# Patient Record
Sex: Female | Born: 1935 | Race: White | Hispanic: No | Smoking: Former smoker
Health system: Southern US, Community
[De-identification: ages and names within clinical notes are randomized; demographics above are authoritative.]

## PROBLEM LIST (undated history)

## (undated) DIAGNOSIS — R921 Mammographic calcification found on diagnostic imaging of breast: Secondary | ICD-10-CM

## (undated) DIAGNOSIS — M1990 Unspecified osteoarthritis, unspecified site: Secondary | ICD-10-CM

## (undated) DIAGNOSIS — K219 Gastro-esophageal reflux disease without esophagitis: Secondary | ICD-10-CM

## (undated) DIAGNOSIS — J449 Chronic obstructive pulmonary disease, unspecified: Secondary | ICD-10-CM

## (undated) HISTORY — DX: Mammographic calcification found on diagnostic imaging of breast: R92.1

## (undated) HISTORY — PX: EYE SURGERY: SHX253

## (undated) HISTORY — PX: FRACTURE SURGERY: SHX138

## (undated) HISTORY — DX: Chronic obstructive pulmonary disease, unspecified: J44.9

## (undated) HISTORY — DX: Gastro-esophageal reflux disease without esophagitis: K21.9

## (undated) HISTORY — PX: TONSILLECTOMY: SUR1361

## (undated) HISTORY — PX: COLONOSCOPY: SHX174

---

## 2000-05-26 ENCOUNTER — Emergency Department (HOSPITAL_COMMUNITY): Admission: EM | Admit: 2000-05-26 | Discharge: 2000-05-26 | Payer: Self-pay | Admitting: Emergency Medicine

## 2000-05-26 ENCOUNTER — Encounter: Payer: Self-pay | Admitting: Emergency Medicine

## 2001-04-19 ENCOUNTER — Other Ambulatory Visit: Admission: RE | Admit: 2001-04-19 | Discharge: 2001-04-19 | Payer: Self-pay | Admitting: Family Medicine

## 2001-04-27 ENCOUNTER — Encounter: Admission: RE | Admit: 2001-04-27 | Discharge: 2001-04-27 | Payer: Self-pay | Admitting: Family Medicine

## 2001-04-27 ENCOUNTER — Encounter: Payer: Self-pay | Admitting: Family Medicine

## 2001-06-09 ENCOUNTER — Encounter: Payer: Self-pay | Admitting: Family Medicine

## 2001-06-09 ENCOUNTER — Encounter: Admission: RE | Admit: 2001-06-09 | Discharge: 2001-06-09 | Payer: Self-pay | Admitting: Family Medicine

## 2001-06-14 ENCOUNTER — Encounter: Admission: RE | Admit: 2001-06-14 | Discharge: 2001-06-14 | Payer: Self-pay | Admitting: Family Medicine

## 2001-06-14 ENCOUNTER — Encounter: Payer: Self-pay | Admitting: Family Medicine

## 2002-05-01 ENCOUNTER — Encounter: Admission: RE | Admit: 2002-05-01 | Discharge: 2002-05-01 | Payer: Self-pay | Admitting: Family Medicine

## 2002-05-01 ENCOUNTER — Encounter: Payer: Self-pay | Admitting: Family Medicine

## 2002-05-19 ENCOUNTER — Encounter: Payer: Self-pay | Admitting: Cardiology

## 2002-05-19 ENCOUNTER — Encounter: Payer: Self-pay | Admitting: *Deleted

## 2002-05-19 ENCOUNTER — Observation Stay (HOSPITAL_COMMUNITY): Admission: EM | Admit: 2002-05-19 | Discharge: 2002-05-20 | Payer: Self-pay | Admitting: *Deleted

## 2002-05-20 ENCOUNTER — Encounter: Payer: Self-pay | Admitting: Cardiology

## 2003-05-08 ENCOUNTER — Encounter: Payer: Self-pay | Admitting: Family Medicine

## 2003-05-08 ENCOUNTER — Encounter: Admission: RE | Admit: 2003-05-08 | Discharge: 2003-05-08 | Payer: Self-pay | Admitting: Family Medicine

## 2003-09-05 ENCOUNTER — Encounter: Admission: RE | Admit: 2003-09-05 | Discharge: 2003-09-05 | Payer: Self-pay | Admitting: Family Medicine

## 2004-06-30 ENCOUNTER — Encounter: Admission: RE | Admit: 2004-06-30 | Discharge: 2004-06-30 | Payer: Self-pay | Admitting: Family Medicine

## 2005-08-05 ENCOUNTER — Encounter: Admission: RE | Admit: 2005-08-05 | Discharge: 2005-08-05 | Payer: Self-pay | Admitting: Family Medicine

## 2005-08-25 ENCOUNTER — Encounter: Admission: RE | Admit: 2005-08-25 | Discharge: 2005-08-25 | Payer: Self-pay | Admitting: Family Medicine

## 2005-09-08 ENCOUNTER — Other Ambulatory Visit: Admission: RE | Admit: 2005-09-08 | Discharge: 2005-09-08 | Payer: Self-pay | Admitting: Family Medicine

## 2006-05-12 ENCOUNTER — Encounter: Admission: RE | Admit: 2006-05-12 | Discharge: 2006-05-12 | Payer: Self-pay | Admitting: Family Medicine

## 2006-05-26 ENCOUNTER — Encounter: Admission: RE | Admit: 2006-05-26 | Discharge: 2006-05-26 | Payer: Self-pay | Admitting: Family Medicine

## 2006-06-03 ENCOUNTER — Ambulatory Visit: Admission: RE | Admit: 2006-06-03 | Discharge: 2006-06-03 | Payer: Self-pay | Admitting: Emergency Medicine

## 2006-06-03 ENCOUNTER — Ambulatory Visit: Payer: Self-pay | Admitting: Emergency Medicine

## 2006-08-17 ENCOUNTER — Ambulatory Visit: Payer: Self-pay | Admitting: Emergency Medicine

## 2006-08-17 LAB — CONVERTED CEMR LAB
BUN: 11 mg/dL (ref 6–23)
CO2: 28 meq/L (ref 19–32)
Calcium: 10.3 mg/dL (ref 8.4–10.5)
GFR calc non Af Amer: 87 mL/min
Glucose, Bld: 100 mg/dL — ABNORMAL HIGH (ref 70–99)

## 2006-08-24 ENCOUNTER — Ambulatory Visit: Payer: Self-pay | Admitting: Cardiovascular Disease

## 2006-08-30 ENCOUNTER — Ambulatory Visit: Payer: Self-pay | Admitting: Emergency Medicine

## 2006-09-15 ENCOUNTER — Encounter: Admission: RE | Admit: 2006-09-15 | Discharge: 2006-09-15 | Payer: Self-pay | Admitting: Family Medicine

## 2007-02-18 ENCOUNTER — Ambulatory Visit: Payer: Self-pay | Admitting: Emergency Medicine

## 2007-02-18 LAB — CONVERTED CEMR LAB
CO2: 28 meq/L (ref 19–32)
Chloride: 104 meq/L (ref 96–112)
GFR calc Af Amer: 125 mL/min
Glucose, Bld: 97 mg/dL (ref 70–99)
Potassium: 4.3 meq/L (ref 3.5–5.1)
Sodium: 140 meq/L (ref 135–145)

## 2007-02-22 ENCOUNTER — Ambulatory Visit: Payer: Self-pay | Admitting: Cardiology

## 2007-03-01 ENCOUNTER — Ambulatory Visit: Payer: Self-pay | Admitting: Emergency Medicine

## 2007-07-08 DIAGNOSIS — J449 Chronic obstructive pulmonary disease, unspecified: Secondary | ICD-10-CM | POA: Insufficient documentation

## 2007-07-08 DIAGNOSIS — J309 Allergic rhinitis, unspecified: Secondary | ICD-10-CM | POA: Insufficient documentation

## 2007-08-18 ENCOUNTER — Ambulatory Visit: Payer: Self-pay | Admitting: Emergency Medicine

## 2007-08-25 ENCOUNTER — Encounter: Payer: Self-pay | Admitting: Emergency Medicine

## 2007-08-25 ENCOUNTER — Ambulatory Visit: Payer: Self-pay | Admitting: Cardiology

## 2007-08-29 ENCOUNTER — Ambulatory Visit: Payer: Self-pay | Admitting: Emergency Medicine

## 2007-10-07 ENCOUNTER — Telehealth (INDEPENDENT_AMBULATORY_CARE_PROVIDER_SITE_OTHER): Payer: Self-pay | Admitting: *Deleted

## 2007-11-10 ENCOUNTER — Encounter: Admission: RE | Admit: 2007-11-10 | Discharge: 2007-11-10 | Payer: Self-pay | Admitting: Family Medicine

## 2007-11-27 ENCOUNTER — Emergency Department (HOSPITAL_COMMUNITY): Admission: EM | Admit: 2007-11-27 | Discharge: 2007-11-27 | Payer: Self-pay | Admitting: Emergency Medicine

## 2008-01-10 ENCOUNTER — Ambulatory Visit: Payer: Self-pay | Admitting: Emergency Medicine

## 2008-01-10 DIAGNOSIS — J479 Bronchiectasis, uncomplicated: Secondary | ICD-10-CM

## 2008-01-10 DIAGNOSIS — J984 Other disorders of lung: Secondary | ICD-10-CM

## 2008-01-11 ENCOUNTER — Encounter: Payer: Self-pay | Admitting: Emergency Medicine

## 2008-07-12 ENCOUNTER — Ambulatory Visit: Payer: Self-pay | Admitting: Emergency Medicine

## 2008-11-14 ENCOUNTER — Encounter: Admission: RE | Admit: 2008-11-14 | Discharge: 2008-11-14 | Payer: Self-pay | Admitting: Family Medicine

## 2009-07-31 ENCOUNTER — Ambulatory Visit: Payer: Self-pay | Admitting: Emergency Medicine

## 2009-08-01 ENCOUNTER — Telehealth: Payer: Self-pay | Admitting: Emergency Medicine

## 2009-10-21 ENCOUNTER — Ambulatory Visit: Payer: Self-pay | Admitting: Emergency Medicine

## 2010-01-03 ENCOUNTER — Encounter: Admission: RE | Admit: 2010-01-03 | Discharge: 2010-01-03 | Payer: Self-pay | Admitting: Family Medicine

## 2010-03-26 ENCOUNTER — Ambulatory Visit: Payer: Self-pay | Admitting: Emergency Medicine

## 2010-05-06 ENCOUNTER — Encounter: Payer: Self-pay | Admitting: Cardiology

## 2010-05-07 ENCOUNTER — Encounter: Payer: Self-pay | Admitting: Cardiology

## 2010-05-07 ENCOUNTER — Encounter: Admission: RE | Admit: 2010-05-07 | Discharge: 2010-05-07 | Payer: Self-pay | Admitting: Family Medicine

## 2010-05-13 ENCOUNTER — Ambulatory Visit: Payer: Self-pay | Admitting: Cardiology

## 2010-05-13 DIAGNOSIS — R079 Chest pain, unspecified: Secondary | ICD-10-CM

## 2010-05-13 DIAGNOSIS — R0602 Shortness of breath: Secondary | ICD-10-CM | POA: Insufficient documentation

## 2010-05-16 ENCOUNTER — Ambulatory Visit: Payer: Self-pay | Admitting: Emergency Medicine

## 2010-05-21 ENCOUNTER — Ambulatory Visit: Payer: Self-pay | Admitting: Cardiology

## 2010-05-21 ENCOUNTER — Encounter: Payer: Self-pay | Admitting: Cardiology

## 2010-05-26 ENCOUNTER — Encounter (INDEPENDENT_AMBULATORY_CARE_PROVIDER_SITE_OTHER): Payer: Self-pay | Admitting: *Deleted

## 2010-10-28 ENCOUNTER — Encounter: Payer: Self-pay | Admitting: Critical Care Medicine

## 2010-11-02 LAB — CONVERTED CEMR LAB
Calcium: 10.4 mg/dL (ref 8.4–10.5)
GFR calc Af Amer: 125 mL/min
GFR calc non Af Amer: 104 mL/min
Sodium: 140 meq/L (ref 135–145)

## 2010-11-03 ENCOUNTER — Encounter
Admission: RE | Admit: 2010-11-03 | Discharge: 2010-11-03 | Payer: Self-pay | Source: Home / Self Care | Attending: Internal Medicine | Admitting: Internal Medicine

## 2010-11-04 NOTE — Assessment & Plan Note (Signed)
Summary: Karen Costa/chest pain abn ekg/jml   Visit Type:  Initial Consult Primary Provider:  Laren Boom Joseph Art)   History of Present Illness: The patient presents for evaluation of chest discomfort and abnormal EKG.  She was evaluated 8 years ago for chest discomfort and had a negative stress perfusion study. Since that time she has had problems with dyspnea and has been treated for reflux causing cough as well as bronchiectasis. She remains active doing some farming. She said that about a week and a half ago she had some chest discomfort. She really points to a left upper quadrant abdominal discomfort or lower chest discomfort. She said it was sharp and fleeting. She said that otherwise she has not been getting any substernal chest pressure, neck or arm discomfort. She has not been having any palpitations, presyncope or syncope. She is not having any PND or orthopnea. She will get short of breath if she bends over to do work. She has some very mild lower extremities swelling if she stands for a long time.  Of note I did review her EKG from August 2. She had normal sinus rhythm. 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 I did not have an old EKG for comparison.  Preventive Screening-Counseling & Management  Alcohol-Tobacco     Smoking Status: quit     Year Quit: 1985  Current Medications (verified): 1)  Flutter Valve .... Use Flutter Valve 6 Times A Day As Needed 2)  Proair Hfa 108 (90 Base) Mcg/act  Aers (Albuterol Sulfate) .... Use 2 Puffs Every 4 Hours As Needed For Shortness of Breath 3)  Vitamin D 1000 Unit Tabs (Cholecalciferol) .Marland Kitchen.. 1 By Mouth Daily 4)  Ocuvite Preservision  Tabs (Multiple Vitamins-Minerals) .Marland Kitchen.. 1 By Mouth Daily 5)  Fish Oil 1000 Mg Caps (Omega-3 Fatty Acids) .Marland Kitchen.. 1 By Mouth Daily 6)  Prilosec 20 Mg Cpdr (Omeprazole) .Marland Kitchen.. 1 By Mouth Daily 7)  Fosamax 70 Mg Tabs (Alendronate Sodium) .... Once A Week 8)  Calcium  Carbonate-Vitamin D 600-400 Mg-Unit  Tabs (Calcium Carbonate-Vitamin D) .... Take 1 Tablet By Mouth Two Times A Day 9)  Vein-Gard .... Apply Topically Two Times A Day  Allergies (verified): No Known Drug Allergies  Past History:  Past Medical History: Bronchiectasis GERD  Past Surgical History: Reviewed history from 05/12/2010 and no changes required. Tonsillectomy  Family History: Father-heart disease (age 41s), CA-?type Brother-heart disease (age 18, CABG) sister-heart disease (unclear heart problems) mother-breast CA  Review of Systems       As stated in the HPI and negative for all other systems.   Vital Signs:  Patient profile:   77 year old female Height:      64 inches Weight:      115 pounds Pulse rate:   85 / minute BP sitting:   148 / 88  (left arm) Cuff size:   regular  Vitals Entered By: Carlye Grippe (May 13, 2010 11:37 AM)  Physical Exam  General:  Well developed, well nourished, in no acute distress. Head:  normocephalic and atraumatic Eyes:  PERRLA/EOM intact; conjunctiva and lids normal. Mouth:  Teeth, gums and palate normal. Oral mucosa normal. Neck:  Neck supple, no JVD. No masses, thyromegaly or abnormal cervical nodes. Chest Wall:  no deformities or breast masses noted Lungs:  Clear bilaterally to auscultation and percussion. Abdomen:  Bowel sounds positive; abdomen soft and non-tender without masses, organomegaly, or hernias noted. No hepatosplenomegaly. Msk:  Back normal,  normal gait. Muscle strength and tone normal. Extremities:  No clubbing or cyanosis. Neurologic:  Alert and oriented x 3. Skin:  Intact without lesions or rashes. Cervical Nodes:  no significant adenopathy Axillary Nodes:  no significant adenopathy Inguinal Nodes:  no significant adenopathy Psych:  Normal affect.   Detailed Cardiovascular Exam  Neck    Carotids: Carotids full and equal bilaterally without bruits.      Neck Veins: 4cm JVD.    Heart     Inspection: no deformities or lifts noted.      Palpation: normal PMI with no thrills palpable.      Auscultation: S1 and S2 within normal limits, no S3, no S4, midsystolic click without murmur  Vascular    Abdominal Aorta: no palpable masses, pulsations, or audible bruits.      Femoral Pulses: normal femoral pulses bilaterally.      Pedal Pulses: normal pedal pulses bilaterally.      Radial Pulses: normal radial pulses bilaterally.      Peripheral Circulation: no clubbing, cyanosis, or edema noted with normal capillary refill.     Impression & Recommendations:  Problem # 1:  DYSPNEA (ICD-786.05) The patient does have some dyspnea which is mild and probably related to chronic lung disease. She does have a slight abnormality on physical exam as described. I would like to followup with an echocardiogram to rule out in particular any mitral valve disorder. If this is normal I would not suggest further cardiovascular testing.  Problem # 2:  CHEST PAIN (ICD-786.50) Her chest discomfort was fleeting and atypical. She has no significant risk factors. At this point I am not suggesting stress perfusion or other stress testing.  Other Orders: 2-D Echocardiogram (2D Echo)  Patient Instructions: 1)  Your physician has requested that you have an echocardiogram.  Echocardiography is a painless test that uses sound waves to create images of your heart. It provides your doctor with information about the size and shape of your heart and how well your heart's chambers and valves are working.  This procedure takes approximately one hour. There are no restrictions for this procedure. If the results of your test are normal or stable, you will receive a letter. If they are abnormal, the nurse will contact you by phone.  2)  Follow up as needed

## 2010-11-04 NOTE — Assessment & Plan Note (Signed)
Summary: cough, bronchiectasis   Visit Type:  Follow-up Primary Provider/Referring Provider:  Laren Boom Joseph Art)  CC:  6 mo COPD and bronchiectasis follow-up.  The patient says she has done better off Advair and Spiriva. Her cough is much better since taking Prilosec daily.Marland Kitchen  History of Present Illness: [name] is a 77 year old woman who follows up today for her COPD and bronchiectasis.  She also has a history of a right sided pulmonary nodule. her last CT Scan, 11/08, showed stable RLL scar. At that visit we decided that she did not require further serial scanning unles sx develop (none done since).   ROV 07/31/09 -- returns for regular f/u. Staying very active without breathing limitation. Cough is stable, every day, clear to tan phlegm.  Taking Spiriva once daily, Advair two times a day. Rarely needs albuterol  ROV 10/21/09 -- She returns for f/u. Tells me that she started omeprazole in 11/10 and that it made her cough MUCH better. She also stopped taking the Spiriva and Advair. Her activity level hasn't suffered - doing quite well. Occasionally uses flutter valve. Denies cough!!  June 22, 201--Presents for an acute office visit. Complains of sore throat, sneezing, prod cough with white mucus, tightness in chest, some chills x1.5weeks. She had some leftover Augmentin which she is on day 3/10. Starting to feel some better. Denies chest pain,  orthopnea, hemoptysis, fever, n/v/d, edema, headache. No OTC used.   ROV 05/16/10 -- f/u for cough and bronchiectasis. Recently treated for possible bronchitis in setting URI. Still with some morning cough. Believes that Prilosec has helped with GERD and cough. Tells me that she recently had an ECG at her PCP, was different and referred her to see Dr Antoine Poche. Planning for TTE next week. Able to exert without limitation. Rare ProAir use.   Current Medications (verified): 1)  Flutter Valve .... Use Flutter Valve 6 Times A Day As Needed 2)   Proair Hfa 108 (90 Base) Mcg/act  Aers (Albuterol Sulfate) .... Use 2 Puffs Every 4 Hours As Needed For Shortness of Breath 3)  Vitamin D 1000 Unit Tabs (Cholecalciferol) .Marland Kitchen.. 1 By Mouth Daily 4)  Ocuvite Preservision  Tabs (Multiple Vitamins-Minerals) .Marland Kitchen.. 1 By Mouth Daily 5)  Fish Oil 1000 Mg Caps (Omega-3 Fatty Acids) .Marland Kitchen.. 1 By Mouth Daily 6)  Prilosec 20 Mg Cpdr (Omeprazole) .Marland Kitchen.. 1 By Mouth Daily 7)  Fosamax 70 Mg Tabs (Alendronate Sodium) .... Once A Week 8)  Calcium Carbonate-Vitamin D 600-400 Mg-Unit  Tabs (Calcium Carbonate-Vitamin D) .... Take 1 Tablet By Mouth Two Times A Day 9)  Vein-Gard .... Apply Topically Two Times A Day  Allergies (verified): No Known Drug Allergies  Vital Signs:  Patient profile:   77 year old female Height:      64 inches (162.56 cm) Weight:      116 pounds (52.73 kg) BMI:     19.98 O2 Sat:      94 % on Room air Temp:     98.1 degrees F (36.72 degrees C) oral Pulse rate:   85 / minute BP sitting:   118 / 72  (left arm) Cuff size:   regular  Vitals Entered By: Michel Bickers CMA (May 16, 2010 11:01 AM)  O2 Sat at Rest %:  94 O2 Flow:  Room air CC: 6 mo COPD and bronchiectasis follow-up.  The patient says she has done better off Advair and Spiriva. Her cough is much better since taking Prilosec daily. Comments Medications reviewed with the  patient. Daytime phone verified. Michel Bickers CMA  May 16, 2010 11:02 AM   Physical Exam  General:  well developed, well nourished, in no acute distress Eyes:  conjunctiva and sclera clear Nose:  no deformity, discharge, inflammation, or lesions Mouth:  no deformity or lesions Neck:  no masses, thyromegaly, or abnormal cervical nodes Lungs:  scattered squeeks L > R Heart:  regular rate and rhythm, S1, S2 without murmurs, rubs, gallops, or clicks Abdomen:  not examined Msk:  no deformity or scoliosis noted with normal posture Extremities:  no clubbing, cyanosis, edema, or deformity noted Skin:  intact  without lesions or rashes Psych:  alert and cooperative; normal mood and affect; normal attention span and concentration   Impression & Recommendations:  Problem # 1:  COPD (ICD-496) Doing well off scheduled BD's - as needed ProAir.  - rov q42yr or as needed   Problem # 2:  BRONCHIECTASIS WITHOUT ACUTE EXACERBATION (ICD-494.0)  Other Orders: Est. Patient Level III ([address])  Patient Instructions: 1)  Continue your ProAir as needed  2)  Follow up with Dr Delton Coombes in 1 year or sooner if you have any problems.

## 2010-11-04 NOTE — Medication Information (Signed)
Summary: RX Folder/ MED LIST [facility]  RX Folder/ MED LIST FAMILY MEDICINE   Imported By: Dorise Hiss 05/08/2010 16:30:32  _____________________________________________________________________  External Attachment:    Type:   Image     Comment:   External Document

## 2010-11-04 NOTE — Assessment & Plan Note (Signed)
Summary: Acute NP office visit   Primary Provider/Referring Provider:  Mazzochi  CC:  sore throat, sneezing, prod cough with white mucus, tightness in chest, some chills x1.5weeks - states had amoxicillin at home and took it sun, and mon and tues.  History of Present Illness: Karen Costa is a 77 year old woman who follows up today for her COPD and bronchiectasis.  She also has a history of a right sided pulmonary nodule. her last CT Scan, 11/08, showed stable RLL scar. At that visit we decided that she did not require further serial scanning unles sx develop (none done since).   ROV 07/31/09 -- returns for regular f/u. Staying very active without breathing limitation. Cough is stable, every day, clear to tan phlegm.  Taking Spiriva once daily, Advair two times a day. Rarely needs albuterol  ROV 10/21/09 -- She returns for f/u. Tells me that she started omeprazole in 11/10 and that it made her cough MUCH better. She also stopped taking the Spiriva and Advair. Her activity level hasn't suffered - doing quite well. Occasionally uses flutter valve. Denies cough!!  June 22, 201--Presents for an acute office visit. Complains of sore throat, sneezing, prod cough with white mucus, tightness in chest, some chills x1.5weeks. She had some leftover Augmentin which she is on day 3/10. Starting to feel some better. Denies chest pain,  orthopnea, hemoptysis, fever, n/v/d, edema, headache. No OTC used.   Medications Prior to Update: 1)  Flutter Valve .... Use Flutter Valve 6 Times A Day As Needed 2)  Proair Hfa 108 (90 Base) Mcg/act  Aers (Albuterol Sulfate) .... Use 2 Puffs Every 4 Hours As Needed For Shortness of Breath 3)  Vitamin D 1000 Unit Tabs (Cholecalciferol) .Marland Kitchen.. 1 By Mouth Daily 4)  Ocuvite Preservision  Tabs (Multiple Vitamins-Minerals) .Marland Kitchen.. 1 By Mouth Daily 5)  Fish Oil 1000 Mg Caps (Omega-3 Fatty Acids) .Marland Kitchen.. 1 By Mouth Daily 6)  New Chapter Bone Strength .... 1 By Mouth Daily 7)  Prilosec 20 Mg  Cpdr (Omeprazole) .Marland Kitchen.. 1 By Mouth Daily  Current Medications (verified): 1)  Flutter Valve .... Use Flutter Valve 6 Times A Day As Needed 2)  Proair Hfa 108 (90 Base) Mcg/act  Aers (Albuterol Sulfate) .... Use 2 Puffs Every 4 Hours As Needed For Shortness of Breath 3)  Vitamin D 1000 Unit Tabs (Cholecalciferol) .Marland Kitchen.. 1 By Mouth Daily 4)  Ocuvite Preservision  Tabs (Multiple Vitamins-Minerals) .Marland Kitchen.. 1 By Mouth Daily 5)  Fish Oil 1000 Mg Caps (Omega-3 Fatty Acids) .Marland Kitchen.. 1 By Mouth Daily 6)  Prilosec 20 Mg Cpdr (Omeprazole) .Marland Kitchen.. 1 By Mouth Daily 7)  Fosamax 70 Mg Tabs (Alendronate Sodium) .... Once A Week 8)  Calcium Carbonate-Vitamin D 600-400 Mg-Unit  Tabs (Calcium Carbonate-Vitamin D) .... Take 1 Tablet By Mouth Two Times A Day  Allergies (verified): No Known Drug Allergies  Past History:  Past Medical History: Last updated: 07/08/2007 Allergic rhinitis COPD  Family History: Last updated: 07/12/2008 father-heart disease, CA-?type brother-heart disease sister-heart disease mother-breast CA  Social History: Last updated: 07/12/2008 Patient states former smoker. Quit smoking 20 yrs ago.  Smoked x 41yrs upto 1ppd. Pt is married with children.   Pt is retired Visual merchandiser.  Risk Factors: Smoking Status: quit (07/12/2008) Packs/Day: erratic (07/08/2007)  Review of Systems      See HPI  Vital Signs:  Patient profile:   77 year old female Height:      64 inches Weight:      117 pounds BMI:  20.16 O2 Sat:      94 % on Room air Temp:     97.5 degrees F oral Pulse rate:   85 / minute BP sitting:   130 / 80  (left arm) Cuff size:   regular  Vitals Entered By: Boone Master CNA/MA (March 26, 2010 9:33 AM)  O2 Flow:  Room air CC: sore throat, sneezing, prod cough with white mucus, tightness in chest, some chills x1.5weeks - states had amoxicillin at home and took it sun, mon and tues Is Patient Diabetic? No Comments Medications reviewed with patient Daytime contact number  verified with patient. Boone Master CNA/MA  March 26, 2010 9:35 AM    Physical Exam  Additional Exam:  GEN: A/Ox3; pleasant , NAD HEENT:  [place]/AT, , EACs-clear, TMs-wnl, NOSE-clear, THROAT-clear NECK:  Supple w/ fair ROM; no JVD; normal carotid impulses w/o bruits; no thyromegaly or nodules palpated; no lymphadenopathy. RESP  Coarse BS w/ no wheeizng  CARD:  RRR, no m/r/g   GI:   Soft & nt; nml bowel sounds; no organomegaly or masses detected. [place]: Warm bil,  no calf tenderness edema Neuro: no focal deficits noted.    Impression & Recommendations:  Problem # 1:  BRONCHIECTASIS WITHOUT ACUTE EXACERBATION (ICD-494.0) Flare REC : Hold fish oil and fosamax for 2 weeks until cough is gone.  Finish Augmentin 875mg  two times a day (total 10 days) Mucinex DM two times a day as needed cough/congestion Increase fluids.  Please contact office for sooner follow up if symptoms do not improve or worsen  follow up Dr. Delton Coombes as scheduled and as needed   Medications Added to Medication List This Visit: 1)  Fosamax 70 Mg Tabs (Alendronate sodium) .... Once a week 2)  Calcium Carbonate-vitamin D 600-400 Mg-unit Tabs (Calcium carbonate-vitamin d) .... Take 1 tablet by mouth two times a day  Complete Medication List: 1)  Flutter Valve  .... Use flutter valve 6 times a day as needed 2)  Proair Hfa 108 (90 Base) Mcg/act Aers (Albuterol sulfate) .... Use 2 puffs every 4 hours as needed for shortness of breath 3)  Vitamin D 1000 Unit Tabs (Cholecalciferol) .Marland Kitchen.. 1 by mouth daily 4)  Ocuvite Preservision Tabs (Multiple vitamins-minerals) .Marland Kitchen.. 1 by mouth daily 5)  Fish Oil 1000 Mg Caps (Omega-3 fatty acids) .Marland Kitchen.. 1 by mouth daily 6)  Prilosec 20 Mg Cpdr (Omeprazole) .Marland Kitchen.. 1 by mouth daily 7)  Fosamax 70 Mg Tabs (Alendronate sodium) .... Once a week 8)  Calcium Carbonate-vitamin D 600-400 Mg-unit Tabs (Calcium carbonate-vitamin d) .... Take 1 tablet by mouth two times a day  Other Orders: Est. Patient  Level IV ([address])  Patient Instructions: 1)  Hold fish oil and fosamax for 2 weeks until cough is gone.  2)  Finish Augmentin 875mg  two times a day (total 10 days) 3)  Mucinex DM two times a day as needed cough/congestion 4)  Increase fluids.  5)  Please contact office for sooner follow up if symptoms do not improve or worsen  6)  follow up Dr. Delton Coombes as scheduled and as needed    Immunization History:  Influenza Immunization History:    Influenza:  historical (08/05/2009)   Appended Document: Orders Update - neb tx    Clinical Lists Changes  Orders: Added new Service order of Nebulizer Tx ([id number]) - Signed

## 2010-11-04 NOTE — Assessment & Plan Note (Signed)
Summary: bronchiectasis, mild COPD   Visit Type:  Follow-up Primary Provider/Referring Provider:  Mazzochi  CC:  COPD.  Bronchiectasis. The patient says her cough is gone. She has not been using the Spiriva and Advair. She says she started taking Prilosec daily and her cough is gone.Marland Kitchen  History of Present Illness: Karen Costa is a 77 year old woman who follows up today for her COPD and bronchiectasis.  She also has a history of a right sided pulmonary nodule. her last CT Scan, 11/08, showed stable RLL scar. At that visit we decided that she did not require further serial scanning unles sx develop (none done since).   ROV 07/31/09 -- returns for regular f/u. Staying very active without breathing limitation. Cough is stable, every day, clear to tan phlegm.  Taking Spiriva once daily, Advair two times a day. Rarely needs albuterol  ROV 10/21/09 -- She returns for f/u. Tells me that she started omeprazole in 11/10 and that it made her cough MUCH better. She also stopped taking the Spiriva and Advair. Her activity level hasn't suffered - doing quite well. Occasionally uses flutter valve. Denies cough!!  Current Medications (verified): 1)  Flutter Valve .... Use Flutter Valve 6 Times A Day As Needed 2)  Proair Hfa 108 (90 Base) Mcg/act  Aers (Albuterol Sulfate) .... Use 2 Puffs Every 4 Hours As Needed For Shortness of Breath 3)  Vitamin D 1000 Unit Tabs (Cholecalciferol) .Marland Kitchen.. 1 By Mouth Daily 4)  Ocuvite Preservision  Tabs (Multiple Vitamins-Minerals) .Marland Kitchen.. 1 By Mouth Daily 5)  Fish Oil 1000 Mg Caps (Omega-3 Fatty Acids) .Marland Kitchen.. 1 By Mouth Daily 6)  New Chapter Bone Strength .... 1 By Mouth Daily 7)  Prilosec 20 Mg Cpdr (Omeprazole) .Marland Kitchen.. 1 By Mouth Daily  Allergies (verified): No Known Drug Allergies  Vital Signs:  Patient profile:   77 year old female Height:      64 inches (162.56 cm) Weight:      126.13 pounds (57.33 kg) BMI:     21.73 O2 Sat:      96 % on Room air Temp:     97.9 degrees F  (36.61 degrees C) oral Pulse rate:   92 / minute BP sitting:   124 / 80  (left arm) Cuff size:   regular  Vitals Entered By: Karen Costa CMA (October 21, 2009 10:18 AM)  O2 Sat at Rest %:  96 O2 Flow:  Room air  Physical Exam  General:  well developed, well nourished, in no acute distress Eyes:  conjunctiva and sclera clear Nose:  no deformity, discharge, inflammation, or lesions Mouth:  no deformity or lesions Neck:  no masses, thyromegaly, or abnormal cervical nodes Lungs:  scattered squeeks L > R Heart:  regular rate and rhythm, S1, S2 without murmurs, rubs, gallops, or clicks Abdomen:  not examined Msk:  no deformity or scoliosis noted with normal posture Extremities:  no clubbing, cyanosis, edema, or deformity noted Skin:  intact without lesions or rashes Psych:  alert and cooperative; normal mood and affect; normal attention span and concentration   Impression & Recommendations:  Problem # 1:  BRONCHIECTASIS WITHOUT ACUTE EXACERBATION (ICD-494.0)  Problem # 2:  COPD (ICD-496) Has done well of BD's - d/c spiriva and advair - ProAir available as needed - if new symptoms evolve then repeat spiro and decide whether to add back long-acting meds - ROV 6 - 12 months  Problem # 3:  LUNG NODULE (ICD-518.89) Not currently following CT's  Medications Added to  Medication List This Visit: 1)  Prilosec 20 Mg Cpdr (Omeprazole) .Marland Kitchen.. 1 by mouth daily  Other Orders: Est. Patient Level III ([address])  Patient Instructions: 1)  Stop Advair and Spiriva 2)  Continue to have ProAir available to use as needed  3)  Use your flutter valve as needed 4)  Stay on omeprazole once daily  5)  Follow up with Dr Delton Coombes in 6 months or if your breathing changes in any way.

## 2010-11-04 NOTE — Letter (Signed)
Summary: Discharge Summary  Discharge Summary   Imported By: Dorise Hiss 05/13/2010 08:26:34  _____________________________________________________________________  External Attachment:    Type:   Image     Comment:   External Document

## 2010-11-04 NOTE — Letter (Signed)
Summary: Engineer, materials at [facility]  [address]   [place], [place] [address]   Phone: [phone]  Fax: [phone]        May 26, 2010 MRN: [id number]    Karen Costa [address] [place], [place]  [address]    Dear Ms. Yehuda Mao,  Your test ordered by Selena Batten has been reviewed by your physician (or physician assistant) and was found to be normal or stable. Your physician (or physician assistant) felt no changes were needed at this time.  _X___ Echocardiogram  ____ Cardiac Stress Test  ____ Lab Work  ____ Peripheral vascular study of arms, legs or neck  ____ CT scan or X-ray  ____ Lung or Breathing test  ____ Other:   Thank you.   Cyril Loosen, RN, BSN    Duane Boston, M.D., F.A.C.C. Thressa Sheller, M.D., F.A.C.C. Oneal Grout, M.D., F.A.C.C. Cheree Ditto, M.D., F.A.C.C. Daiva Nakayama, M.D., F.A.C.C. Kenney Houseman, M.D., F.A.C.C. Jeanne Ivan, PA-C

## 2010-11-04 NOTE — Letter (Signed)
Summary: External Correspondence/ OFFICE VISIT DR. Raquel James  External Correspondence/ OFFICE VISIT DR. Raquel James   Imported By: Dorise Hiss 05/08/2010 16:26:16  _____________________________________________________________________  External Attachment:    Type:   Image     Comment:   External Document

## 2010-12-11 NOTE — Letter (Signed)
Summary: Thayer Headings MD  Thayer Headings MD   Imported By: Lester  12/01/2010 10:10:57  _____________________________________________________________________  External Attachment:    Type:   Image     Comment:   External Document

## 2011-01-21 ENCOUNTER — Other Ambulatory Visit: Payer: Self-pay | Admitting: Internal Medicine

## 2011-01-21 DIAGNOSIS — Z1231 Encounter for screening mammogram for malignant neoplasm of breast: Secondary | ICD-10-CM

## 2011-02-06 ENCOUNTER — Ambulatory Visit
Admission: RE | Admit: 2011-02-06 | Discharge: 2011-02-06 | Disposition: A | Discharge status: Home / Self Care | Payer: MEDICARE | Source: Ambulatory Visit | Attending: Internal Medicine | Admitting: Internal Medicine

## 2011-02-06 DIAGNOSIS — Z1231 Encounter for screening mammogram for malignant neoplasm of breast: Secondary | ICD-10-CM

## 2011-02-17 NOTE — Assessment & Plan Note (Signed)
[facility]                             PULMONARY OFFICE NOTE   NHI, BUTRUM                   MRN:          [id number]  DATE:03/01/2007                            DOB:          09/21/1930    SUBJECTIVE:  [name] is a 77 year old woman who follows up today for  her COPD and bronchiectasis.  She also has a history of a right sided  pulmonary nodule.  Since our last visit, she has been doing fairly well.  She continues to cough every day, but usually she produces clear sputum.  She stopped using her flutter valve with regularity and also  discontinued her Mucinex.  Is having less wheezing and remains quite  active.  They changed their heating system from oil heating to electric  space heaters, and she feels like this has improved her breathing as  well.   CURRENT MEDICATIONS:  1. Advair 250/50 one inhalation b.i.d.  2. Spiriva one inhalation daily.  3. Aspirin 81 mg daily.  4. Vitamin C 1000 mg daily.  5. Calcium Plus Vitamin D 600 mg daily.  6. PreserVision one b.i.d.  7. Vitamin E 400 units daily.  8. Centrum multivitamin once daily.  9. Albuterol two puffs q.4 h p.r.n. shortness of breath, which she is      not requiring.   PHYSICAL EXAMINATION:  GENERAL:  This is an elderly, pleasant, thin  woman who is in no distress on room air.  VITAL SIGNS:  Temperature 98.8, blood pressure 132/80, heart rate 110,  SPO2 93% on room air.  HEENT:  Oropharynx is clear.  She has no stridor.  LUNGS:  Clear to auscultation bilaterally, although, her breath sounds  are somewhat distant.  She does not wheeze on forced expiration.  HEART:  Regular rate and rhythm without murmur.  ABDOMEN:  Benign.  EXTREMITIES:  No cyanosis, clubbing or edema.  NEUROLOGICAL:  Grossly nonfocal exam.   STUDIES:  CT scan of the chest was performed on Feb 22, 2007.  This  showed complete resolution of her previously noted right lower lobe  patchy density.  She  also has a decrease in size in her right lower lobe  nodular density that measured 6x7 mm on her scan from November 2007.  It  now measures approximately 5 mm in greatest diameter.   IMPRESSION:  1. Chronic obstructive pulmonary disease.  2. Bronchiectasis without any evidence of chronic opportunistic      infection.  3. Pulmonary nodule that is smaller on CT scan of the chest.   PLAN:  1. Continue Advair and Spiriva as ordered with Albuterol p.r.n.  2. She may use her flutter valve p.r.n., but I have asked her to      change it back to standing usage if she has increase in dyspnea or      sputum production.  3. I will repeat CT scan of her chest in six months to follow her      right lower lobe nodular infiltrate for interval change.  4. Followup in six months to review the results of her CT scan or  sooner if she has any difficulty in the interim.     Leslye Peer, MD  Electronically Signed    RSB/MedQ  DD: 03/01/2007  DT: 03/01/2007  Job #: [id number]   cc:   Talmadge Coventry, M.D.

## 2011-02-20 NOTE — Assessment & Plan Note (Signed)
[facility]                             PULMONARY OFFICE NOTE   Karen Costa, Karen Costa                   MRN:          [id number]  DATE:08/30/2006                            DOB:          1930-09-25    SUBJECTIVE:  Karen Costa is a 77 year old woman with COPD and  bronchiectasis, first seen in August 2007.  At that time she had a right-  sided nodule on CT scan of the chest.  I asked her to continue her  bronchodilators, use a flutter valve, and to repeat her CT scan to  insure no evidence of interval increase in the size of her pulmonary  nodule.  She follows up today telling me that she feels much better.  She has been using her flutter valve every day, and also believes that  she has benefited from Mucinex, which she has been taking twice a day.   MEDICATIONS:  1. Advair 250/50 one inhalation b.i.d.  2. Spiriva 1 inhalation daily.  3. Aspirin 81 mg daily.  4. Vitamin C 1000 mg daily.  5. Calcium plus vitamin D 600 mg daily.  6. Flutter valve, which she uses approximately 6 times daily.   EXAMINATION:  GENERAL:  This is a pleasant, well-appearing woman, in no  distress.  Her weight is 122 pounds.  Temperature 98.4.  Blood pressure 132/74.  Heart rate 81.  SPO2 95% on room air.  LUNG EXAM:  Has a focal left-sided inspiratory squeak.  There are no  significant crackles.  There is no wheezing.  Her exam is otherwise  unchanged from our initial visit in August.   CT scan of the chest was performed on August 24, 2006, and I have  compared this to her previous CTs, the most recent one on May 26, 2006.  The right lower lobe opacity is now smaller and appears less  spiculated.  It is now 7 mm x 6 mm in maximum dimension.  There is a  small 1.4 cm x 1 cm oval area of patchiness in the right lower lobe of  unclear significance.  She continues to have notable cylindrical  bronchiectasis that is most pronounced in the right middle lobe.  There  are no pathologic lymph nodes seen.  The new area of right lower lobe  opacity is potentially consistent with a small amount of resolving  pneumonia.   IMPRESSION:  1. Chronic obstructive pulmonary disease.  2. Bronchiectasis.  3. Abnormal computed tomography scan of the chest with interval      improvement in the appearance of her right lower lobe nodule, now      with evidence of new right lower lobe airspace disease.   PLAN:  1. Continue flutter valve as she is doing.  2. Continue Mucinex as she is doing.  3. Continue her current bronchodilators and add albuterol p.r.n.  4. I would like to see a followup CT scan of the chest in 6 months to      insure that her right lower lobe nodule and airspace disease are      not enlarging over time.  I have her to notify me if she has any      worrisome symptoms including increased cough, fevers, chills,      weight loss or hemoptysis.  If these symptoms appear, then we will      repeat the CT scan in 3 months instead of in 6 months.  5. I will follow up with Karen Costa in 6 months, or sooner if she has      any difficulty in the interim.     Leslye Peer, MD  Electronically Signed    RSB/MedQ  DD: 10/01/2006  DT: 10/01/2006  Job #: [id number]   cc:   Talmadge Coventry, M.D.

## 2011-02-20 NOTE — Assessment & Plan Note (Signed)
[facility]                               PULMONARY OFFICE NOTE   KANDAS, OLIVETO                   MRN:          [id number]  DATE:06/03/2006                            DOB:          [birth year]    REASON FOR CONSULTATION:  We are asked by Dr. Talmadge Coventry to evaluate  Mrs. Karen Costa for COPD and an abnormal CT scan of the chest.   HISTORY:  Mrs. Wickstrom is a 77 year old woman with an approximately 20-pack-  year tobacco history who quit 20 years ago. She also has a history of wood  heating in the home of 20 years which was converted to conventional heating  about 10 years ago. She was diagnosed with COPD and chronic bronchitis in  2003.  She has episodes of cough and purulent sputum that occur about two or  three times a year. She has never seen hemoptysis. She usually requires  therapy with antibiotics and cannot recall any episodes where she needed  corticosteroids.  In between these episodes she is quite active and is able  to walk an exert herself.  She had one of these episodes in the two weeks  prior to this visit.  She was treated with Biaxin and also doxycycline.  She  also receives Xopenex by nebulizer but was unable to tolerate that due to  dry mouth and associated cough.  A PPD was placed which was negative.  Part  of her evaluation included a CT scan of the chest which showed a right  middle lobe nodule. She is referred to for evaluation of her COPD and to  follow her CT scan abnormality.   PAST MEDICAL HISTORY:  1. Tobacco use.  2. Allergic rhinitis.  3. COPD diagnosed in 2003.   ALLERGIES:  No known drug allergies.   MEDICATIONS:  1. Advair 250/50 one inhalation b.i.d.  2. Spiriva one inhalation daily.  3. Aspirin 81 mg daily.  4. Vitamin C 1000 mg daily.  5. Calcium + vitamin D 600 mg daily.  6. Mucinex 600 mg daily p.r.n.   SOCIAL HISTORY:  The patient has a 20-pack-year tobacco history. She quit 20  years  ago.  She is married and lives with her husband.  As mentioned, they  used to heat their home with a wood stove but converted this over 10 years  ago.  She is a retired Higher education careers adviser.  She drinks two  alcoholic beverages each night.   FAMILY HISTORY:  Significant for coronary artery disease  in her father.   REVIEW OF SYSTEMS:  As per the HPI.  Also please note she has had some  shortness of breath with activity, productive cough for the last two weeks,  acid reflux that improved when she stopped coffee, weight loss of 15 pounds  over the last year.  Eczema and a change in color of her mucus.   PHYSICAL EXAMINATION:  GENERAL:  This is a thin, pleasant woman who is in no  distress.  VITAL SIGNS:  Weight 114 pounds, temperature 98.4, blood pressure 134/72,  heart rate 99.  SpO2 96% on room air.  HEENT:  Benign.  HEART:  Regular rate and rhythm without murmur.  LUNGS:  Focal, left-sided inspiratory squeak; otherwise they are clear  without wheezes or rhonchi.  ABDOMEN:  Soft, nontender, nondistended with positive bowel sounds.  EXTREMITIES:  No cyanosis, clubbing or edema.  NEUROLOGIC:  Grossly nonfocal exam.   LABORATORY DATA:  CT scan of the chest shows a right mid lung density that  measures 11 x 5 mm in diameter.  There is associated right lower lobe and  right middle lobe bronchiectatic changes.  There are other areas of chronic  inflammation and scar, particularly at the right apex.  There is no  significant lymphadenopathy.   IMPRESSION:  1. Chronic obstructive pulmonary disease.  2. Bronchiectasis.  3. Abnormal CT scan of the chest with a 1 x 0.5 cm right middle lobe      nodule that may be scarring associated with her bronchiectasis but      certainly could also represent neoplasm.   PLAN:  1. I would continue the same therapy as ordered for her COPD.  2. Add a flutter valve to manage secretion clearance given her      bronchiectasis.  3. Repeat her  CT scan in three months to look for interval change in her      pulmonary nodule.  This nodule is too small to meet the sensitivity      requirements of a PET scan and is not amenable to transbronchial biopsy      by fiberoptic bronchoscopy due to its small size.  If there is an      interval change over the next three months, then we will need to devise      a strategy for biopsy either by fiberoptic bronchoscopy or by VATS      biopsy.                                   Leslye Peer, MD   RSB/MedQ  DD:  06/09/2006  DT:  06/10/2006  Job #:  [id number]   cc:   Talmadge Coventry, M.D.

## 2011-02-20 NOTE — Discharge Summary (Signed)
NAME:  Karen Costa, Karen Costa                      ACCOUNT NO.:  [id number]   MEDICAL RECORD NO.:  [id number]                   PATIENT TYPE:  INP   LOCATION:  3703                                 FACILITY:  [facility]   PHYSICIAN:  Lavella Hammock, PA [facility]              DATE OF BIRTH:  July 19, 1930   DATE OF ADMISSION:  05/19/2002  DATE OF DISCHARGE:                           DISCHARGE SUMMARY - REFERRING   PROCEDURE:  1. Stress Cardiolite.  2. CT of the brain.   HOSPITAL COURSE:  The patient is a 77 year old female with no known history  of coronary artery disease, who developed substernal chest pain described as  a heaviness on the admission that lasted approximately 3 hours.  It was not  pleuritic or positional and was relieved by nitroglycerin.  Additionally,  she had weakness in her lower extremities and in her left upper extremities.  She had no recent illnesses.  Her symptoms were concerning for angina so she  was admitted to rule out MI and for further evaluation.   Her enzymes were negative for MI so she had a stress Cardiolite on  05/20/2002.  The Cardiolite was negative for ischemia or scar and her EF was  normal.  No further workup was indicated at this time, as the patient  remained symptom free.   The patient has a history of asthma and did take Advair b.i.d. prior to  admission.  She still had a mild wheeze even at rest and because of this  Atrovent was added to her medication regimen.  She is to use the Atrovent 4  times a day and continue her other medications.  Additionally she is to take  her blood pressure and report the findings to Dr. Andee Lineman in the office.  She  is to follow up with Dr. Andee Lineman as an outpatient, as well as Dr. Smith Mince.   The patient has a history of mild hyperlipidemia, but she is followed  closely by her family physician for this and no additional in-hospital  workup is needed at this time.   Because of the extremity weakness, a CT of the head was  done and it showed  mild atrophy, but no acute changes.  Because her Cardiolite was negative and  she had no further symptoms, she was considered stable for discharge on  05/20/2002.   Head CT:  No acute changes, mild atrophy.   Chest x-ray:  No cardiomegaly.  No infiltrates.  No acute disease.   LABORATORY VALUES:  Hemoglobin 14.0, hematocrit 42.1, WBC 9.7, platelets  247.  D-dimer 0.36.  Sodium 137, potassium 3.9, chloride 102, CO2 27, BUN 9,  creatinine 0.7, glucose 163.  Fasting glucose 114.  Serial CK-MB negative  for MI.  Troponin I 0.03 to 0.02 and then 0.10.  Hemoglobin A1C and BMP  pending at the time of dictation.   DISCHARGE DIAGNOSES:  1. Chest pain, negative D-dimer and negative Cardiolite this admission.  2. Hypertension.  3. Mild hyperlipidemia.  4. Osteoarthritis.  5. Asthma.  6. Osteoporosis.  7. Status-post tonsillectomy.  8. Family history of coronary artery disease.  9. Non-fasting hyperglycemia, hemoglobin A1C pending.  10.      Possible diastolic dysfunction, follow up with Dr. Andee Lineman.   DISCHARGE INSTRUCTIONS:  1. Her activity level is to be as tolerated.  She is to stick to a low fat     diet.  She is follow up with Dr. Smith Mince as scheduled.  She is to     follow up with Dr. Andee Lineman in the office, we will call.   DISCHARGE MEDICATIONS:  1. Atrovent MDI 2 puffs q.i.d.  2. Advair b.i.d.  3. Miacalcin nasal spray daily.  4. Calcium citrate 1700 mg q.d.  5. Vitamin D 400 mg q.d.  6. Magnesium Oxide 500 mg q.d.  7. Coated Aspirin 81 mg q.d.                                                 Lavella Hammock, PA [facility]    RG/MEDQ  D:  05/20/2002  T:  05/23/2002  Job:  [id number]   cc:   Talmadge Coventry, M.D.   Learta Codding, M.D. [facility]

## 2011-02-20 NOTE — H&P (Signed)
NAME:  Karen Costa, Karen Costa                      ACCOUNT NO.:  [id number]   MEDICAL RECORD NO.:  [id number]                   PATIENT TYPE:  EMS   LOCATION:  MAJO                                 FACILITY:  [facility]   PHYSICIAN:  Madolyn Frieze. Jens Som, M.D. [facility]         DATE OF BIRTH:  December 31, 1929   DATE OF ADMISSION:  05/19/2002  DATE OF DISCHARGE:                                HISTORY & PHYSICAL   The patient is a 77 year old female with past medical history of mild  hyperlipidemia and asthma who we were asked to evaluated for chest pain.  She has no prior cardiac history.  She walks routinely and does not have  exertional chest pain nor does she have dyspnea on exertion, orthopnea,  paroxysmal nocturnal dyspnea, pedal edema, palpitations, presyncope or  syncope.  At approximately 3:00 p.m. today she developed substernal chest  pain that was described as a pressure.  There was no radiation and the  pain was not pleuritic or positional.  There was associated shortness of  breath, but there was no nausea, vomiting, or diaphoresis.  After two hours  she came to the emergency room and the pain improved with Nitroglycerin.  Also at the same time she described a weakness in her lower extremities  bilaterally as well as her left upper extremity.  There was no dysarthria,  incontinence, loss of sensation, or visual disturbance.  Of note, she has  not traveled recently and there has been no hemoptysis or leg trauma.   PAST MEDICAL HISTORY:  Significant for mild hyperlipidemia with a  cholesterol of 215 by report.  She denies any hypertension or diabetes  mellitus.  She does have a history of asthma.  She has a history of  tonsillectomy, but no other surgical history is noted.   FAMILY HISTORY:  Positive for coronary artery disease in both her brother  and her sister.   SOCIAL HISTORY:  She has remote history of tobacco use, but none in the past  12 years.  She occasionally consumes alcohol.   REVIEW OF SYSTEMS:  She denies any headaches, fevers, or chills.  There is  no productive cough or hemoptysis.  There is no dysphagia, odynophagia,  melena, or hematochezia.  There is no dysuria or hematuria.  There is no  rash or seizure activity.  There is no orthopnea, paroxysmal nocturnal  dyspnea, or pedal edema.  She does state that she has had some weakness in  her lower extremities bilaterally and discussed this with Dr. Smith Mince at  her physical examination recently.  There has been no other focal  neurological findings.   PHYSICAL EXAMINATION:  VITAL SIGNS:  Today shows a blood pressure of  164/100, pulse 80.  She is afebrile.  GENERAL:  She is well-developed and well-nourished in no acute distress.  SKIN:  Warm and dry.  HEAD, EYES, EARS, NOSE, THROAT:  Unremarkable with normal eyelids.  NECK:  Supple with no bruits  heard.  There is no jugulovenous distension and  no thyromegaly noted.  CHEST:  Clear to auscultation with normal expansion.  CARDIOVASCULAR:  Reveals a regular rate and rhythm with normal S1 and S2.  There are no murmurs, rubs, or gallops noted.  ABDOMEN:  Not tender or distended, positive bowel sounds, no  hepatosplenomegaly.  No masses appreciated.  There is no abdominal bruit.  She has 2+ femoral pulses bilaterally with no bruits.  EXTREMITIES:  No edema and I could palpate no cords.  She has 2+ dorsalis  pedis pulses bilaterally.  NEUROLOGIC:  Cranial nerves II through XII grossly intact.  She has 5/5  strength in all extremities.  Her sensation is normal to light touch.  There  are no gross focal findings.   LABORATORY DATA:  Shows a white blood cell count of 9.7 with hemoglobin 14  and hematocrit 43.1.  Her platelet count is 247.  Her D-dimer is 0.36 which  was normal.  Sodium was 137 with potassium of 3.9.  Chloride is 102 with a  CO_2_ of 27.  A glucose is elevated at 163.  BUN and creatinine are 9 and  0.7.  Her calcium is 10.  Liver functions are  normal.  Initial CK is 115  with MB of 3.3.  Her troponin-I is 0.03.  Her chest x-ray shows no active  disease.   DIAGNOSES:  1. Atypical chest pain.  2. Elevated glucose.  3. Asthma.  4. History of mild hyperlipidemia.   PLAN:  The patient presents for evaluation of chest pain.  The etiology of  this is not clear to me.  She does not have exertional chest pain and  electrocardiogram shows no acute ST changes.  We will cycle enzymes and  repeat her electrocardiogram in the morning.  If these are negative, then we  will plan to risk stratify with a stress Cardiolite.  Of note, D-dimer is  negative and her history is not consistent with pulmonary embolus.  Also of  note, she did have lower extremity weakness as well as left upper extremity  weakness.  I can find no focal neurological findings, but we will check a  non-contrast head CT to rule out pathology.  Finally, we will need to check  a hemoglobin A1C given her elevated glucose as the patient may have new  onset diabetes mellitus.                                               Madolyn Frieze Jens Som, M.D. [facility]    BSC/MEDQ  D:  05/19/2002  T:  05/23/2002  Job:  [id number]

## 2011-03-11 ENCOUNTER — Encounter: Payer: Self-pay | Admitting: Emergency Medicine

## 2011-03-11 ENCOUNTER — Ambulatory Visit (INDEPENDENT_AMBULATORY_CARE_PROVIDER_SITE_OTHER): Payer: MEDICARE | Admitting: Emergency Medicine

## 2011-03-11 DIAGNOSIS — J479 Bronchiectasis, uncomplicated: Secondary | ICD-10-CM

## 2011-03-11 DIAGNOSIS — J984 Other disorders of lung: Secondary | ICD-10-CM

## 2011-03-11 DIAGNOSIS — J449 Chronic obstructive pulmonary disease, unspecified: Secondary | ICD-10-CM

## 2011-03-11 NOTE — Progress Notes (Signed)
  Subjective:    Patient ID: Karen Costa, female    DOB: [birth year], 77 y.o.   MRN: [id number]  HPI Karen Costa is a 77 year old woman who follows up today for her COPD and bronchiectasis.  She also has a history of a right sided pulmonary nodule. her last CT Scan, 11/08, showed stable RLL scar. At that visit we decided that she did not require further serial scanning unles sx develop (none done since).   ROV 07/31/09 -- returns for regular f/u. Staying very active without breathing limitation. Cough is stable, every day, clear to tan phlegm.  Taking Spiriva once daily, Advair two times a day. Rarely needs albuterol  ROV 10/21/09 -- She returns for f/u. Tells me that she started omeprazole in 11/10 and that it made her cough MUCH better. She also stopped taking the Spiriva and Advair. Her activity level hasn't suffered - doing quite well. Occasionally uses flutter valve. Denies cough!!  June 22, 201--Presents for an acute office visit. Complains of sore throat, sneezing, prod cough with white mucus, tightness in chest, some chills x1.5weeks. She had some leftover Augmentin which she is on day 3/10. Starting to feel some better. Denies chest pain,  orthopnea, hemoptysis, fever, n/v/d, edema, headache. No OTC used.   ROV 05/16/10 -- f/u for cough and bronchiectasis. Recently treated for possible bronchitis in setting URI. Still with some morning cough. Believes that Prilosec has helped with GERD and cough. Tells me that she recently had an ECG at her PCP, was different and referred her to see Karen Costa. Planning for TTE next week. Able to exert without limitation. Rare ProAir use.   ROV 03/11/11 -- Hx of COPD, bronchiectasis and cough. Also w hx of R pulmonary nodule, our last CT scan was 08/2007 (stable at that time). She has seen Karen Costa end of May '12, had cough + dyspnea + colored sputum, treated for COPD/bronchiectasis. She was treated w abx x 2 (azithro then augmentin). She feels better.  Part of the eval included CXR that shows rounded RUL opacity. Comparison w old films shows slight difference in appearance compared with 2008 - the old opacity had some central clearing.    Review of Systems As per HPI      Objective:   Physical Exam  Gen: Pleasant, thin, in no distress,  normal affect  ENT: No lesions,  mouth clear,  oropharynx clear, no postnasal drip  Neck: No JVD, no TMG, no carotid bruits  Lungs: coarse B, rhonchi, no wheeze.   Cardiovascular: RRR, heart sounds normal, no murmur or gallops, no peripheral edema  Musculoskeletal: No deformities, no cyanosis or clubbing  Neuro: alert, non focal  Skin: Warm, no lesions or rashes        Assessment & Plan:

## 2011-03-11 NOTE — Assessment & Plan Note (Signed)
Last scan was 2008. The RUL nodule looks bigger and more solid on CXR. Still may reflect her chronic lung disease, especially since the film was done while she was sick.  - repeat CT scan chest now, no contrast - follow up to review and decide how to proceed, either serial films vs discuss bx.

## 2011-03-11 NOTE — Assessment & Plan Note (Signed)
stable °

## 2011-03-11 NOTE — Assessment & Plan Note (Signed)
Albuterol prn

## 2011-03-11 NOTE — Patient Instructions (Signed)
We will schedule a CT scan of chest to evaluate your pulmonary nodule Continue your albuterol as needed Follow up with Dr Delton Coombes after your scan to review

## 2011-03-13 ENCOUNTER — Ambulatory Visit (INDEPENDENT_AMBULATORY_CARE_PROVIDER_SITE_OTHER)
Admission: RE | Admit: 2011-03-13 | Discharge: 2011-03-13 | Disposition: A | Discharge status: Home / Self Care | Payer: MEDICARE | Source: Ambulatory Visit | Attending: Emergency Medicine | Admitting: Emergency Medicine

## 2011-03-13 DIAGNOSIS — J984 Other disorders of lung: Secondary | ICD-10-CM

## 2011-03-24 ENCOUNTER — Ambulatory Visit (INDEPENDENT_AMBULATORY_CARE_PROVIDER_SITE_OTHER): Payer: MEDICARE | Admitting: Pulmonary Disease

## 2011-03-24 ENCOUNTER — Encounter: Payer: Self-pay | Admitting: Pulmonary Disease

## 2011-03-24 VITALS — BP 130/68 | HR 107 | Temp 97.6°F | Ht 64.0 in | Wt 110.8 lb

## 2011-03-24 DIAGNOSIS — J471 Bronchiectasis with (acute) exacerbation: Secondary | ICD-10-CM | POA: Insufficient documentation

## 2011-03-24 MED ORDER — LEVOFLOXACIN 750 MG PO TABS: 750.0000 mg | ORAL_TABLET | Freq: Every day | ORAL | Status: AC

## 2011-03-24 MED ORDER — PREDNISONE 10 MG PO TABS: ORAL_TABLET | ORAL | Status: DC

## 2011-03-24 NOTE — Progress Notes (Signed)
  Subjective:    Patient ID: Karen Costa, female    DOB: 1930-05-24, 77 y.o.   MRN: [id number]  HPI The pt comes in today for an acute sick visit.  She has known bronchiectasis and copd, and gives a 1-2d h/o worsening chest congestion, cough with purulent mucus, and worsening sob.  She is concerned because she had a similar episode last month and failed treatment with zpak and then improved with augmentin.    Review of Systems  Constitutional: Negative for fever and unexpected weight change.  HENT: Positive for sore throat, rhinorrhea, sneezing and postnasal drip. Negative for ear pain, nosebleeds, congestion, trouble swallowing, dental problem and sinus pressure.   Eyes: Positive for redness and itching.  Respiratory: Positive for cough, shortness of breath and wheezing. Negative for chest tightness.   Cardiovascular: Negative for palpitations and leg swelling.  Gastrointestinal: Negative for nausea and vomiting.  Genitourinary: Negative for dysuria.  Musculoskeletal: Negative for joint swelling.  Skin: Negative for rash.  Neurological: Negative for headaches.  Hematological: Does not bruise/bleed easily.  Psychiatric/Behavioral: Negative for dysphoric mood. The patient is not nervous/anxious.        Objective:   Physical Exam Wd female in nad Nares without purulence or discharge OP clear, no exudates or lesions seen Chest with crackles, pops, squeaks, and a few wheezes Cor with rrr LE without edema, no cyanosis  Alert, oriented moves all 4        Assessment & Plan:

## 2011-03-24 NOTE — Patient Instructions (Signed)
Will treat with a course of levaquin 750mg  one each day until gone Prednisone taper as directed. Please call if you do not improve.

## 2011-03-26 ENCOUNTER — Ambulatory Visit: Payer: MEDICARE | Admitting: Emergency Medicine

## 2011-03-28 NOTE — Assessment & Plan Note (Signed)
The pt's history is most c/w either acute bronchitis or a bronchiectasis flare. The recurrent nature of her symptoms make the latter more likely, and suspect she will need broad spectrum abx when she gets sick.  Would consider sputum culture at some point if this continues.  Will treat with abx at this point to get her thru this episode.  She also has a lot of abnormal lung sounds with wheezing and rhonchi (may be her baseline?).  Will also treat with a course of prednisone.

## 2011-06-15 ENCOUNTER — Ambulatory Visit (INDEPENDENT_AMBULATORY_CARE_PROVIDER_SITE_OTHER): Payer: MEDICARE | Admitting: Adult Health

## 2011-06-15 ENCOUNTER — Encounter: Payer: Self-pay | Admitting: Adult Health

## 2011-06-15 ENCOUNTER — Telehealth: Payer: Self-pay | Admitting: Emergency Medicine

## 2011-06-15 VITALS — BP 116/74 | HR 88 | Temp 97.0°F | Ht 63.0 in | Wt 113.2 lb

## 2011-06-15 DIAGNOSIS — J479 Bronchiectasis, uncomplicated: Secondary | ICD-10-CM

## 2011-06-15 MED ORDER — LEVOFLOXACIN 750 MG PO TABS: 750.0000 mg | ORAL_TABLET | Freq: Every day | ORAL | Status: AC

## 2011-06-15 NOTE — Patient Instructions (Signed)
Will treat with a course of levaquin 750mg  one each day until gone Mucinex DM Twice daily As needed  Cough/congestion  Fluids and rest  Albuterol nebs As needed  Wheezing.  Please contact office for sooner follow up if symptoms do not improve or worsen or seek emergency care  follow up Dr. Delton Coombes as planned and As needed   Hold fish oil for 1 month, then may restart-place in freezer.

## 2011-06-15 NOTE — Progress Notes (Signed)
Subjective:    Patient ID: Karen Costa, female    DOB: 09/02/1930, 77 y.o.   MRN: [id number]  HPI Karen Costa is a 77 year old woman who follows up today for her COPD and bronchiectasis.  She also has a history of a right sided pulmonary nodule. her last CT Scan, 11/08, showed stable RLL scar. At that visit we decided that she did not require further serial scanning unles sx develop (none done since).   ROV 07/31/09 -- returns for regular f/u. Staying very active without breathing limitation. Cough is stable, every day, clear to tan phlegm.  Taking Spiriva once daily, Advair two times a day. Rarely needs albuterol  ROV 10/21/09 -- She returns for f/u. Tells me that she started omeprazole in 11/10 and that it made her cough MUCH better. She also stopped taking the Spiriva and Advair. Her activity level hasn't suffered - doing quite well. Occasionally uses flutter valve. Denies cough!!  June 22, 201--Presents for an acute office visit. Complains of sore throat, sneezing, prod cough with white mucus, tightness in chest, some chills x1.5weeks. She had some leftover Augmentin which she is on day 3/10. Starting to feel some better. Denies chest pain,  orthopnea, hemoptysis, fever, n/v/d, edema, headache. No OTC used.   ROV 05/16/10 -- f/u for cough and bronchiectasis. Recently treated for possible bronchitis in setting URI. Still with some morning cough. Believes that Prilosec has helped with GERD and cough. Tells me that she recently had an ECG at her PCP, was different and referred her to see Dr Antoine Poche. Planning for TTE next week. Able to exert without limitation. Rare ProAir use.   ROV 03/11/11 -- Hx of COPD, bronchiectasis and cough. Also w hx of R pulmonary nodule, our last CT scan was 08/2007 (stable at that time). She has seen Dr Thea Silversmith end of May '12, had cough + dyspnea + colored sputum, treated for COPD/bronchiectasis. She was treated w abx x 2 (azithro then augmentin). She feels better.  Part of the eval included CXR that shows rounded RUL opacity. Comparison w old films shows slight difference in appearance compared with 2008 - the old opacity had some central clearing.  >>tx w/ Levaquin 750mg  , CT chest w/ progressive bronchiectasis in RML   06/15/2011 Acute OV  Pt presents for an acute office visit. Complains of  cough w/ yellow phlem, chest tightness, chest congestion, wheezing x 2 weeks. Pt states her bronchiectasis is acting up.  Seen 3 months ago w/ bronchiectatatic flare , tx w/ levaquin . Says she has felt great until 2 weeks ago. Started with symptoms of cold with nasal drip, cough. NOw cough is getting worse w/ thick mucus. Yellow thick production all day.  Starting to feel bad. No otc used. No fever or hemoptysis.  No edema      Review of Systems Constitutional:   No  weight loss, night sweats,  Fevers, chills, fatigue, or  lassitude.  HEENT:   No headaches,  Difficulty swallowing,  Tooth/dental problems, or  Sore throat,                No sneezing, itching, ear ache, nasal congestion, post nasal drip,   CV:  No chest pain,  Orthopnea, PND, swelling in lower extremities, anasarca, dizziness, palpitations, syncope.   GI  No heartburn, indigestion, abdominal pain, nausea, vomiting, diarrhea, change in bowel habits, loss of appetite, bloody stools.   Resp: + excess mucus, no productive cough,   No coughing up of blood.  No chest wall deformity  Skin: no rash or lesions.  GU: no dysuria, change in color of urine, no urgency or frequency.  No flank pain, no hematuria   MS:  No joint pain or swelling.  No decreased range of motion.  No back pain.  Psych:  No change in mood or affect. No depression or anxiety.  No memory loss.           Objective:   Physical Exam  Gen: Pleasant, thin, in no distress,  normal affect  ENT: No lesions,  mouth clear,  oropharynx clear, no postnasal drip  Neck: No JVD, no TMG, no carotid bruits  Lungs: coarse B, rhonchi,  no wheeze.   Cardiovascular: RRR, heart sounds normal, no murmur or gallops, no peripheral edema  Musculoskeletal: No deformities, no cyanosis or clubbing  Neuro: alert, non focal  Skin: Warm, no lesions or rashes     CT chest 03/11/11  Similar to minimal increase in relatively diffuse interstitial  and reticular nodular opacities. Concurrent right middle lobe  bronchiectasis and volume loss. 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  2. No evidence of dominant right upper lobe pulmonary nodule or  mass.      Assessment & Plan:

## 2011-06-15 NOTE — Telephone Encounter (Signed)
I spoke with pt and she states her bronchiectasis is acting up and requests to be seen today. I advised her RB scheduled was booked up but she cld come in and see TP at 2:45. Pt states she will come in at that time. Nothing further was needed

## 2011-06-15 NOTE — Assessment & Plan Note (Signed)
Flare:   Plan :  Will treat with a course of levaquin 750mg  x 7 days  Mucinex DM Twice daily As needed  Cough/congestion  Fluids and rest  Albuterol nebs As needed  Wheezing.  Please contact office for sooner follow up if symptoms do not improve or worsen or seek emergency care  follow up Dr. Delton Coombes as planned and As needed   Hold fish oil for 1 month, then may restart-place in freezer.

## 2011-07-06 ENCOUNTER — Other Ambulatory Visit: Payer: Self-pay | Admitting: Emergency Medicine

## 2011-07-07 ENCOUNTER — Other Ambulatory Visit: Payer: Self-pay | Admitting: Internal Medicine

## 2011-07-07 DIAGNOSIS — R1011 Right upper quadrant pain: Secondary | ICD-10-CM

## 2011-07-09 ENCOUNTER — Ambulatory Visit
Admission: RE | Admit: 2011-07-09 | Discharge: 2011-07-09 | Disposition: A | Discharge status: Home / Self Care | Payer: MEDICARE | Source: Ambulatory Visit | Attending: Internal Medicine | Admitting: Internal Medicine

## 2011-07-09 DIAGNOSIS — R1011 Right upper quadrant pain: Secondary | ICD-10-CM

## 2011-09-18 ENCOUNTER — Encounter: Payer: Self-pay | Admitting: Emergency Medicine

## 2011-09-18 ENCOUNTER — Ambulatory Visit (INDEPENDENT_AMBULATORY_CARE_PROVIDER_SITE_OTHER): Payer: MEDICARE | Admitting: Emergency Medicine

## 2011-09-18 DIAGNOSIS — J984 Other disorders of lung: Secondary | ICD-10-CM

## 2011-09-18 DIAGNOSIS — J479 Bronchiectasis, uncomplicated: Secondary | ICD-10-CM

## 2011-09-18 NOTE — Assessment & Plan Note (Signed)
Stable - no changes to meds; she is not requiring scheduled BDs at this time

## 2011-09-18 NOTE — Patient Instructions (Signed)
Please continue your current medications as you are taking them  Follow with Dr Dain Laseter in 6 months or sooner if you have any problems  

## 2011-09-18 NOTE — Assessment & Plan Note (Signed)
No t seen on Ct scan 03/11/11

## 2011-09-18 NOTE — Progress Notes (Signed)
Subjective:    Patient ID: Karen Costa, female    DOB: 06/09/1930, 77 y.o.   MRN: [id number]  HPI Ms. Berkey is a 77 year old woman who follows up today for her COPD and bronchiectasis.  She also has a history of a right sided pulmonary nodule. her last CT Scan, 11/08, showed stable RLL scar. At that visit we decided that she did not require further serial scanning unles sx develop (none done since).   ROV 07/31/09 -- returns for regular f/u. Staying very active without breathing limitation. Cough is stable, every day, clear to tan phlegm.  Taking Spiriva once daily, Advair two times a day. Rarely needs albuterol  ROV 10/21/09 -- She returns for f/u. Tells me that she started omeprazole in 11/10 and that it made her cough MUCH better. She also stopped taking the Spiriva and Advair. Her activity level hasn't suffered - doing quite well. Occasionally uses flutter valve. Denies cough!!  June 22, 201--Presents for an acute office visit. Complains of sore throat, sneezing, prod cough with white mucus, tightness in chest, some chills x1.5weeks. She had some leftover Augmentin which she is on day 3/10. Starting to feel some better. Denies chest pain,  orthopnea, hemoptysis, fever, n/v/d, edema, headache. No OTC used.   ROV 05/16/10 -- f/u for cough and bronchiectasis. Recently treated for possible bronchitis in setting URI. Still with some morning cough. Believes that Prilosec has helped with GERD and cough. Tells me that she recently had an ECG at her PCP, was different and referred her to see Dr Antoine Poche. Planning for TTE next week. Able to exert without limitation. Rare ProAir use.   ROV 03/11/11 -- Hx of COPD, bronchiectasis and cough. Also w hx of R pulmonary nodule, our last CT scan was 08/2007 (stable at that time). She has seen Dr Thea Silversmith end of May '12, had cough + dyspnea + colored sputum, treated for COPD/bronchiectasis. She was treated w abx x 2 (azithro then augmentin). She feels better.  Part of the eval included CXR that shows rounded RUL opacity. Comparison w old films shows slight difference in appearance compared with 2008 - the old opacity had some central clearing.  >>tx w/ Levaquin 750mg  , CT chest w/ progressive bronchiectasis in RML   06/15/11 Acute OV  Pt presents for an acute office visit. Complains of  cough w/ yellow phlem, chest tightness, chest congestion, wheezing x 2 weeks. Pt states her bronchiectasis is acting up.  Seen 3 months ago w/ bronchiectatatic flare , tx w/ levaquin . Says she has felt great until 2 weeks ago. Started with symptoms of cold with nasal drip, cough. NOw cough is getting worse w/ thick mucus. Yellow thick production all day.  Starting to feel bad. No otc used. No fever or hemoptysis.  No edema    ROV 09/18/11 -- 77 yo woman, Hx of COPD, bronchiectasis and cough.  Exacerbation 9/12 as above. She is now feeling better, back to baseline. Not using flutter regularly. Minimal cough. Remains on prilosec, improved her cough. No longer on Advair or Spiriva. Uses a SABA 1-2 x week.       Objective:   Physical Exam  Gen: Pleasant, thin, in no distress,  normal affect  ENT: No lesions,  mouth clear,  oropharynx clear, no postnasal drip  Neck: No JVD, no TMG, no carotid bruits  Lungs: coarse B, rhonchi, no wheeze.   Cardiovascular: RRR, heart sounds normal, no murmur or gallops, no peripheral edema  Musculoskeletal: No deformities,  no cyanosis or clubbing  Neuro: alert, non focal  Skin: Warm, no lesions or rashes     CT chest 03/11/11  Similar to minimal increase in relatively diffuse interstitial  and reticular nodular opacities. Concurrent right middle lobe  bronchiectasis and volume loss. 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  2. No evidence of dominant right upper lobe pulmonary nodule or  mass.      Assessment & Plan:  LUNG NODULE No t seen on Ct scan  03/11/11  BRONCHIECTASIS WITHOUT ACUTE EXACERBATION Stable - no changes to meds; she is not requiring scheduled BDs at this time

## 2012-03-22 ENCOUNTER — Encounter: Payer: Self-pay | Admitting: Emergency Medicine

## 2012-03-22 ENCOUNTER — Ambulatory Visit (INDEPENDENT_AMBULATORY_CARE_PROVIDER_SITE_OTHER): Payer: Medicare Other | Admitting: Emergency Medicine

## 2012-03-22 VITALS — BP 120/62 | HR 97 | Temp 97.0°F | Ht 63.0 in | Wt 109.6 lb

## 2012-03-22 DIAGNOSIS — J479 Bronchiectasis, uncomplicated: Secondary | ICD-10-CM

## 2012-03-22 NOTE — Patient Instructions (Addendum)
Please continue to use your ProAir as you needed Use your fluticasone nose spray as needed.  Follow with Dr Delton Coombes in 6 months or sooner if you have any problems

## 2012-03-22 NOTE — Assessment & Plan Note (Signed)
Recent exacerbation that is now improved. At her baseline.  - continue same regimen.

## 2012-03-22 NOTE — Progress Notes (Signed)
Subjective:    Patient ID: Karen Costa, female    DOB: [birth year], 78 y.o.   MRN: [id number]  HPI Karen Costa is a 78 year old woman who follows up today for her COPD and bronchiectasis.  She also has a history of a right sided pulmonary nodule. her last CT Scan, 11/08, showed stable RLL scar. At that visit we decided that she did not require further serial scanning unles sx develop (none done since).   ROV 07/31/09 -- returns for regular f/u. Staying very active without breathing limitation. Cough is stable, every day, clear to tan phlegm.  Taking Spiriva once daily, Advair two times a day. Rarely needs albuterol  ROV 10/21/09 -- She returns for f/u. Tells me that she started omeprazole in 11/10 and that it made her cough MUCH better. She also stopped taking the Spiriva and Advair. Her activity level hasn't suffered - doing quite well. Occasionally uses flutter valve. Denies cough!!  June 22, 201--Presents for an acute office visit. Complains of sore throat, sneezing, prod cough with white mucus, tightness in chest, some chills x1.5weeks. She had some leftover Augmentin which she is on day 3/10. Starting to feel some better. Denies chest pain,  orthopnea, hemoptysis, fever, n/v/d, edema, headache. No OTC used.   ROV 05/16/10 -- f/u for cough and bronchiectasis. Recently treated for possible bronchitis in setting URI. Still with some morning cough. Believes that Prilosec has helped with GERD and cough. Tells me that she recently had an ECG at her PCP, was different and referred her to see Dr Antoine Poche. Planning for TTE next week. Able to exert without limitation. Rare ProAir use.   ROV 03/11/11 -- Hx of COPD, bronchiectasis and cough. Also w hx of R pulmonary nodule, our last CT scan was 08/2007 (stable at that time). She has seen Dr Thea Silversmith end of May '12, had cough + dyspnea + colored sputum, treated for COPD/bronchiectasis. She was treated w abx x 2 (azithro then augmentin). She feels better.  Part of the eval included CXR that shows rounded RUL opacity. Comparison w old films shows slight difference in appearance compared with 2008 - the old opacity had some central clearing.  >>tx w/ Levaquin 750mg  , CT chest w/ progressive bronchiectasis in RML   06/15/11 Acute OV  Pt presents for an acute office visit. Complains of  cough w/ yellow phlem, chest tightness, chest congestion, wheezing x 2 weeks. Pt states her bronchiectasis is acting up.  Seen 3 months ago w/ bronchiectatatic flare , tx w/ levaquin . Says she has felt great until 2 weeks ago. Started with symptoms of cold with nasal drip, cough. NOw cough is getting worse w/ thick mucus. Yellow thick production all day.  Starting to feel bad. No otc used. No fever or hemoptysis.  No edema    ROV 09/18/11 -- 78 yo woman, Hx of COPD, bronchiectasis and cough.  Has a URI vs allergies in May, turned into bronchitis, treated with levaquin.  She has been using SABA rarely.        Objective:   Physical Exam Filed Vitals:   03/22/12 1440  BP: 120/62  Pulse: 97  Temp: 97 F (36.1 C)   Gen: Pleasant, thin, in no distress,  normal affect  ENT: No lesions,  mouth clear,  oropharynx clear, no postnasal drip  Neck: No JVD, no TMG, no carotid bruits  Lungs: coarse B, rhonchi, no wheeze. Few insp squeeks  Cardiovascular: RRR, heart sounds normal, no murmur or gallops, no  peripheral edema  Musculoskeletal: No deformities, no cyanosis or clubbing  Neuro: alert, non focal  Skin: Warm, no lesions or rashes     CT chest 03/11/11  Comparison: 08/25/2007.  Findings: Lung windows demonstrate similar narrowing and  irregularity of the lobar and segmental bronchi to the right middle  lobe on image 28 of series 3.  Pleural parenchymal scarring at the right apex is unchanged.  Diffuse interstitial and reticular nodular opacities are similar.  Some areas demonstrate minimal progression. For example, a  subpleural interstitial opacity in the  right lower lobe on image 39  series 3 is new. A subpleural 4 mm nodular density in the left  upper lobe on image 29 is new.  There is no dominant right upper lobe nodule or mass identified.  Bronchiectasis in the right middle lobe with volume loss is  similar. Probable scarring in the subpleural left lower lobe at 9  mm on image 39.  Soft tissue windows demonstrate cardiomegaly accentuated by pectus  deformity. No pericardial or pleural effusion. Multivessel  coronary artery atherosclerosis. No mediastinal or definite hilar  adenopathy, given limitations of unenhanced CT.  Limited abdominal imaging demonstrates right hepatic lobe cyst. No  significant findings. No acute osseous abnormality. Exaggeration  of thoracic kyphosis.  IMPRESSION:  1. Similar to minimal increase in relatively diffuse interstitial  and reticular nodular opacities. Concurrent right middle lobe  bronchiectasis and volume loss. 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  2. No evidence of dominant right upper lobe pulmonary nodule or  mass.     Assessment & Plan:  BRONCHIECTASIS WITHOUT ACUTE EXACERBATION Recent exacerbation that is now improved. At her baseline.  - continue same regimen.

## 2012-05-02 ENCOUNTER — Other Ambulatory Visit: Payer: Self-pay | Admitting: Internal Medicine

## 2012-05-02 DIAGNOSIS — Z1231 Encounter for screening mammogram for malignant neoplasm of breast: Secondary | ICD-10-CM

## 2012-05-24 ENCOUNTER — Ambulatory Visit
Admission: RE | Admit: 2012-05-24 | Discharge: 2012-05-24 | Disposition: A | Discharge status: Home / Self Care | Payer: Medicare Other | Source: Ambulatory Visit | Attending: Internal Medicine | Admitting: Internal Medicine

## 2012-05-24 DIAGNOSIS — Z1231 Encounter for screening mammogram for malignant neoplasm of breast: Secondary | ICD-10-CM

## 2012-05-30 ENCOUNTER — Other Ambulatory Visit: Payer: Self-pay | Admitting: Internal Medicine

## 2012-05-30 DIAGNOSIS — R928 Other abnormal and inconclusive findings on diagnostic imaging of breast: Secondary | ICD-10-CM

## 2012-05-31 ENCOUNTER — Ambulatory Visit
Admission: RE | Admit: 2012-05-31 | Discharge: 2012-05-31 | Disposition: A | Discharge status: Home / Self Care | Payer: Medicare Other | Source: Ambulatory Visit | Attending: Internal Medicine | Admitting: Internal Medicine

## 2012-05-31 ENCOUNTER — Other Ambulatory Visit: Payer: Self-pay | Admitting: Internal Medicine

## 2012-05-31 DIAGNOSIS — R921 Mammographic calcification found on diagnostic imaging of breast: Secondary | ICD-10-CM

## 2012-05-31 DIAGNOSIS — R928 Other abnormal and inconclusive findings on diagnostic imaging of breast: Secondary | ICD-10-CM

## 2012-06-09 ENCOUNTER — Ambulatory Visit
Admission: RE | Admit: 2012-06-09 | Discharge: 2012-06-09 | Disposition: A | Discharge status: Home / Self Care | Payer: Medicare Other | Source: Ambulatory Visit | Attending: Internal Medicine | Admitting: Internal Medicine

## 2012-06-09 DIAGNOSIS — R921 Mammographic calcification found on diagnostic imaging of breast: Secondary | ICD-10-CM

## 2012-06-27 ENCOUNTER — Encounter (INDEPENDENT_AMBULATORY_CARE_PROVIDER_SITE_OTHER): Payer: Self-pay | Admitting: Surgery

## 2012-06-27 ENCOUNTER — Ambulatory Visit (INDEPENDENT_AMBULATORY_CARE_PROVIDER_SITE_OTHER): Payer: Medicare Other | Admitting: Surgery

## 2012-06-27 ENCOUNTER — Other Ambulatory Visit (INDEPENDENT_AMBULATORY_CARE_PROVIDER_SITE_OTHER): Payer: Self-pay | Admitting: Surgery

## 2012-06-27 VITALS — BP 126/82 | HR 92 | Temp 98.3°F | Resp 20 | Ht 63.0 in | Wt 106.4 lb

## 2012-06-27 DIAGNOSIS — R921 Mammographic calcification found on diagnostic imaging of breast: Secondary | ICD-10-CM

## 2012-06-27 DIAGNOSIS — R928 Other abnormal and inconclusive findings on diagnostic imaging of breast: Secondary | ICD-10-CM

## 2012-06-27 HISTORY — DX: Mammographic calcification found on diagnostic imaging of breast: R92.1

## 2012-06-27 NOTE — Progress Notes (Signed)
Patient ID: Karen Costa, female   DOB: 04/18/1930, 78 y.o.   MRN: [id number]  Chief Complaint  Patient presents with  . Breast Problem    new pt- eval rt breast calcifications     HPI Karen Costa is a 78 y.o. female. She is seen today for evaluation recommendations about a focal area of suspicious calcifications found on a recent mammogram. They're in the right breast. We were not amenable to a needle core biopsy. Radiologist recommended surgical excisional biopsy. She has no breast symptoms or other prior breast problems. She has good understanding of the issues involved from conversations with the radiologist. HPI  Past Medical History  Diagnosis Date  . GERD (gastroesophageal reflux disease)   . Bronchiectasis   . COPD (chronic obstructive pulmonary disease)   . Osteoporosis   . Breast calcifications     right breast    Past Surgical History  Procedure Date  . Tonsillectomy     Family History  Problem Relation Age of Onset  . Heart disease Father   . Cancer Father     colon  . Heart disease Brother   . Heart disease Sister   . Cancer Sister     colon  . Breast cancer Mother   . Cancer Mother     breast    Social History History  Substance Use Topics  . Smoking status: Former Smoker -- 25 years    Types: Cigarettes    Quit date: 10/05/1990  . Smokeless tobacco: Never Used  . Alcohol Use: Yes    No Known Allergies  Current Outpatient Prescriptions  Medication Sig Dispense Refill  . Calcium Carbonate-Vitamin D (CALCIUM + D) 600-200 MG-UNIT TABS Take 1 tablet by mouth 2 (two) times daily.       . fish oil-omega-3 fatty acids 1000 MG capsule Take 2 g by mouth daily.        . fluticasone (FLONASE) 50 MCG/ACT nasal spray Place into the nose. 2 sprays daily      . hydrocortisone 2.5 % cream as needed.      . levofloxacin (LEVAQUIN) 750 MG tablet Take 750 mg by mouth as directed.      . Multiple Vitamins-Minerals (OCUVITE PRESERVISION) TABS Take by  mouth daily.        . omeprazole (PRILOSEC) 20 MG capsule Take 20 mg by mouth daily.        . PROVENTIL HFA 108 (90 BASE) MCG/ACT inhaler 2 PUFFS EVERY 4 HOURS AS NEEDED FOR SHORTNESS OF BREATH  8.5 each  6  . raloxifene (EVISTA) 60 MG tablet Take 60 mg by mouth daily.          Review of Systems Review of Systems  Constitutional: Negative for fever, chills and unexpected weight change.  HENT: Positive for hearing loss. Negative for congestion, sore throat, trouble swallowing and voice change.   Eyes: Negative for visual disturbance.  Respiratory: Positive for cough and wheezing.   Cardiovascular: Negative for chest pain, palpitations and leg swelling.  Gastrointestinal: Positive for diarrhea. Negative for nausea, vomiting, abdominal pain, constipation, blood in stool, abdominal distention and anal bleeding.  Genitourinary: Negative for hematuria, vaginal bleeding and difficulty urinating.  Musculoskeletal: Negative for arthralgias.  Skin: Negative for rash and wound.  Neurological: Negative for seizures, syncope and headaches.  Hematological: Negative for adenopathy. Bruises/bleeds easily.  Psychiatric/Behavioral: Negative for confusion.    Blood pressure 126/82, pulse 92, temperature 98.3 F (36.8 C), temperature source Temporal, resp. rate 20, height   5' 3" (1.6 m), weight 106 lb 6.4 oz (48.263 kg).  Physical Exam Physical Exam  Vitals reviewed. Constitutional: She is oriented to person, place, and time. She appears well-developed and well-nourished. No distress.  HENT:  Head: Normocephalic and atraumatic.  Mouth/Throat: Oropharynx is clear and moist.  Eyes: Conjunctivae normal and EOM are normal. Pupils are equal, round, and reactive to light. No scleral icterus.  Neck: Normal range of motion. Neck supple. No tracheal deviation present. No thyromegaly present.  Cardiovascular: Normal rate, regular rhythm, normal heart sounds and intact distal pulses.  Exam reveals no gallop and no  friction rub.   No murmur heard. Pulmonary/Chest: Effort normal and breath sounds normal. No respiratory distress. She has no wheezes. She has no rales.       The breasts are bilaterally symmetric. There is no palpable mass and no significant tenderness. There is small and somewhat irregular consistent with dense fibrocystic type breast changes. The skin of the nipple under real of normal.  There are a few scattered wheezes in her lungs. No respiratory distress however.  Abdominal: Soft. Bowel sounds are normal. She exhibits no distension and no mass. There is no tenderness. There is no rebound and no guarding.  Musculoskeletal: Normal range of motion. She exhibits no edema and no tenderness.  Lymphadenopathy:    She has no cervical adenopathy.    She has no axillary adenopathy.       Right axillary: No pectoral adenopathy present.       Right: No supraclavicular adenopathy present.  Neurological: She is alert and oriented to person, place, and time.  Skin: Skin is warm and dry. No rash noted. She is not diaphoretic. No erythema.  Psychiatric: She has a normal mood and affect. Her behavior is normal. Judgment and thought content normal.    Data Reviewed I have reviewed mammogram reports and old medical records  Assessment    Moderately suspicious calcifications right breast    Plan    Wire localized excisional biopsy. I have discussed the indications for the lumpectomy and described the procedure. She understand that the chance of removal of the abnormal area is very good, but that occasionally we are unable to locate it and may have to do a second procedure. We also discussed the possibility of a second procedure to get additional tissue. Risks of surgery such as bleeding and infection have also been explained, as well as the implications of not doing the surgery. She understands and wishes to proceed.        Sylus Stgermain J 06/27/2012, 1:04 PM    

## 2012-06-27 NOTE — Patient Instructions (Addendum)
We will schedule surgery to remove the small area of calcifications in your right breast to be sure they are not a small area of cancer

## 2012-07-15 ENCOUNTER — Encounter (HOSPITAL_BASED_OUTPATIENT_CLINIC_OR_DEPARTMENT_OTHER): Payer: Self-pay | Admitting: *Deleted

## 2012-07-15 NOTE — Progress Notes (Signed)
Pt has copd-stable follows [place] pulm-cxr stable Saw dr Kerr Lions 2011 fo rt bbb-echo normal-no need for other testing No cardiac problems

## 2012-07-21 ENCOUNTER — Ambulatory Visit (HOSPITAL_BASED_OUTPATIENT_CLINIC_OR_DEPARTMENT_OTHER): Payer: Medicare Other | Admitting: *Deleted

## 2012-07-21 ENCOUNTER — Encounter (HOSPITAL_BASED_OUTPATIENT_CLINIC_OR_DEPARTMENT_OTHER): Payer: Self-pay | Admitting: *Deleted

## 2012-07-21 ENCOUNTER — Ambulatory Visit (HOSPITAL_BASED_OUTPATIENT_CLINIC_OR_DEPARTMENT_OTHER)
Admission: RE | Admit: 2012-07-21 | Discharge: 2012-07-21 | Disposition: A | Discharge status: Home / Self Care | Payer: Medicare Other | Source: Ambulatory Visit | Attending: Surgery | Admitting: Surgery

## 2012-07-21 ENCOUNTER — Ambulatory Visit
Admission: RE | Admit: 2012-07-21 | Discharge: 2012-07-21 | Disposition: A | Discharge status: Home / Self Care | Payer: Medicare Other | Source: Ambulatory Visit | Attending: Surgery | Admitting: Surgery

## 2012-07-21 ENCOUNTER — Encounter (HOSPITAL_BASED_OUTPATIENT_CLINIC_OR_DEPARTMENT_OTHER): Payer: Self-pay | Admitting: Surgery

## 2012-07-21 ENCOUNTER — Encounter (HOSPITAL_BASED_OUTPATIENT_CLINIC_OR_DEPARTMENT_OTHER): Payer: Self-pay

## 2012-07-21 ENCOUNTER — Encounter (HOSPITAL_BASED_OUTPATIENT_CLINIC_OR_DEPARTMENT_OTHER)
Admission: RE | Disposition: A | Discharge status: Home / Self Care | Payer: Self-pay | Source: Ambulatory Visit | Attending: Surgery

## 2012-07-21 DIAGNOSIS — J449 Chronic obstructive pulmonary disease, unspecified: Secondary | ICD-10-CM | POA: Insufficient documentation

## 2012-07-21 DIAGNOSIS — N6019 Diffuse cystic mastopathy of unspecified breast: Secondary | ICD-10-CM

## 2012-07-21 DIAGNOSIS — R921 Mammographic calcification found on diagnostic imaging of breast: Secondary | ICD-10-CM | POA: Diagnosis present

## 2012-07-21 DIAGNOSIS — J4489 Other specified chronic obstructive pulmonary disease: Secondary | ICD-10-CM | POA: Insufficient documentation

## 2012-07-21 DIAGNOSIS — R928 Other abnormal and inconclusive findings on diagnostic imaging of breast: Secondary | ICD-10-CM | POA: Insufficient documentation

## 2012-07-21 HISTORY — PX: BREAST BIOPSY: SHX20

## 2012-07-21 HISTORY — DX: Unspecified osteoarthritis, unspecified site: M19.90

## 2012-07-21 HISTORY — DX: Mammographic calcification found on diagnostic imaging of breast: R92.1

## 2012-07-21 LAB — POCT HEMOGLOBIN-HEMACUE: Hemoglobin: 15 g/dL (ref 12.0–15.0)

## 2012-07-21 SURGERY — BREAST BIOPSY WITH NEEDLE LOCALIZATION
Anesthesia: Monitor Anesthesia Care | Site: Breast | Laterality: Right | Wound class: Clean

## 2012-07-21 MED ORDER — DROPERIDOL 2.5 MG/ML IJ SOLN: 0.6250 mg | INTRAMUSCULAR | Status: DC | PRN

## 2012-07-21 MED ORDER — FENTANYL CITRATE 0.05 MG/ML IJ SOLN
INTRAMUSCULAR | Status: DC | PRN
  Administered 2012-07-21 (×2): 25 ug via INTRAVENOUS

## 2012-07-21 MED ORDER — LACTATED RINGERS IV SOLN
INTRAVENOUS | Status: DC
  Administered 2012-07-21: 12:00:00 via INTRAVENOUS

## 2012-07-21 MED ORDER — OXYCODONE HCL 5 MG/5ML PO SOLN: 5.0000 mg | Freq: Once | ORAL | Status: DC | PRN

## 2012-07-21 MED ORDER — HYDROCODONE-ACETAMINOPHEN 5-325 MG PO TABS: 1.0000 | ORAL_TABLET | ORAL | Status: DC | PRN

## 2012-07-21 MED ORDER — LIDOCAINE-EPINEPHRINE (PF) 1 %-1:200000 IJ SOLN
INTRAMUSCULAR | Status: DC | PRN
  Administered 2012-07-21: 13:00:00

## 2012-07-21 MED ORDER — PROPOFOL 10 MG/ML IV EMUL
INTRAVENOUS | Status: DC | PRN
  Administered 2012-07-21: 100 ug/kg/min via INTRAVENOUS

## 2012-07-21 MED ORDER — OXYCODONE HCL 5 MG PO TABS: 5.0000 mg | ORAL_TABLET | Freq: Once | ORAL | Status: DC | PRN

## 2012-07-21 MED ORDER — LIDOCAINE HCL (CARDIAC) 20 MG/ML IV SOLN
INTRAVENOUS | Status: DC | PRN
  Administered 2012-07-21: 20 mg via INTRAVENOUS

## 2012-07-21 MED ORDER — CEFAZOLIN SODIUM-DEXTROSE 2-3 GM-% IV SOLR
2.0000 g | INTRAVENOUS | Status: AC
  Administered 2012-07-21: 2 g via INTRAVENOUS

## 2012-07-21 MED ORDER — HYDROMORPHONE HCL PF 1 MG/ML IJ SOLN: 0.2500 mg | INTRAMUSCULAR | Status: DC | PRN

## 2012-07-21 MED ORDER — ALBUTEROL SULFATE HFA 108 (90 BASE) MCG/ACT IN AERS
2.0000 | INHALATION_SPRAY | RESPIRATORY_TRACT | Status: AC
  Administered 2012-07-21: 2 via RESPIRATORY_TRACT

## 2012-07-21 MED ORDER — CHLORHEXIDINE GLUCONATE 4 % EX LIQD: 1.0000 "application " | Freq: Once | CUTANEOUS | Status: DC

## 2012-07-21 SURGICAL SUPPLY — 53 items
ADH SKN CLS APL DERMABOND .7 (GAUZE/BANDAGES/DRESSINGS) ×1
APPLICATOR COTTON TIP 6IN STRL (MISCELLANEOUS) IMPLANT
BINDER BREAST LRG (GAUZE/BANDAGES/DRESSINGS) IMPLANT
BINDER BREAST MEDIUM (GAUZE/BANDAGES/DRESSINGS) IMPLANT
BINDER BREAST XLRG (GAUZE/BANDAGES/DRESSINGS) IMPLANT
BINDER BREAST XXLRG (GAUZE/BANDAGES/DRESSINGS) IMPLANT
BLADE HEX COATED 2.75 (ELECTRODE) ×2 IMPLANT
BLADE SURG 15 STRL LF DISP TIS (BLADE) ×1 IMPLANT
BLADE SURG 15 STRL SS (BLADE)
CANISTER SUCTION 1200CC (MISCELLANEOUS) ×2 IMPLANT
CHLORAPREP W/TINT 26ML (MISCELLANEOUS) ×2 IMPLANT
CLIP TI MEDIUM 6 (CLIP) IMPLANT
CLIP TI WIDE RED SMALL 6 (CLIP) IMPLANT
CLOTH BEACON ORANGE TIMEOUT ST (SAFETY) ×2 IMPLANT
COVER MAYO STAND STRL (DRAPES) ×2 IMPLANT
COVER TABLE BACK 60X90 (DRAPES) ×2 IMPLANT
DECANTER SPIKE VIAL GLASS SM (MISCELLANEOUS) IMPLANT
DERMABOND ADVANCED (GAUZE/BANDAGES/DRESSINGS) ×1
DERMABOND ADVANCED .7 DNX12 (GAUZE/BANDAGES/DRESSINGS) ×1 IMPLANT
DEVICE DUBIN W/COMP PLATE 8390 (MISCELLANEOUS) ×1 IMPLANT
DRAPE LAPAROTOMY TRNSV 102X78 (DRAPE) ×2 IMPLANT
DRAPE SURG 17X23 STRL (DRAPES) ×2 IMPLANT
DRAPE UTILITY XL STRL (DRAPES) ×2 IMPLANT
ELECT REM PT RETURN 9FT ADLT (ELECTROSURGICAL) ×2
ELECTRODE REM PT RTRN 9FT ADLT (ELECTROSURGICAL) ×1 IMPLANT
GLOVE BIO SURGEON STRL SZ 6.5 (GLOVE) ×1 IMPLANT
GLOVE BIO SURGEON STRL SZ7.5 (GLOVE) ×1 IMPLANT
GLOVE BIOGEL M STRL SZ7.5 (GLOVE) ×1 IMPLANT
GLOVE BIOGEL PI IND STRL 8 (GLOVE) IMPLANT
GLOVE BIOGEL PI INDICATOR 8 (GLOVE) ×1
GLOVE EUDERMIC 7 POWDERFREE (GLOVE) ×2 IMPLANT
GOWN PREVENTION PLUS XLARGE (GOWN DISPOSABLE) ×4 IMPLANT
GOWN STRL REIN 2XL LVL4 (GOWN DISPOSABLE) ×1 IMPLANT
KIT MARKER MARGIN INK (KITS) IMPLANT
NDL HYPO 25X1 1.5 SAFETY (NEEDLE) ×1 IMPLANT
NEEDLE HYPO 25X1 1.5 SAFETY (NEEDLE) ×2 IMPLANT
NS IRRIG 1000ML POUR BTL (IV SOLUTION) IMPLANT
PACK BASIN DAY SURGERY FS (CUSTOM PROCEDURE TRAY) ×2 IMPLANT
PENCIL BUTTON HOLSTER BLD 10FT (ELECTRODE) ×2 IMPLANT
SHEET MEDIUM DRAPE 40X70 STRL (DRAPES) ×2 IMPLANT
SLEEVE SCD COMPRESS KNEE MED (MISCELLANEOUS) ×2 IMPLANT
SPONGE GAUZE 4X4 12PLY (GAUZE/BANDAGES/DRESSINGS) IMPLANT
SPONGE INTESTINAL PEANUT (DISPOSABLE) IMPLANT
SPONGE LAP 4X18 X RAY DECT (DISPOSABLE) ×2 IMPLANT
STAPLER VISISTAT 35W (STAPLE) IMPLANT
SUT MNCRL AB 4-0 PS2 18 (SUTURE) ×2 IMPLANT
SUT SILK 0 TIES 10X30 (SUTURE) IMPLANT
SUT VICRYL 3-0 CR8 SH (SUTURE) ×2 IMPLANT
SYR CONTROL 10ML LL (SYRINGE) ×2 IMPLANT
TOWEL OR NON WOVEN STRL DISP B (DISPOSABLE) ×2 IMPLANT
TUBE CONNECTING 20X1/4 (TUBING) ×2 IMPLANT
WATER STERILE IRR 1000ML POUR (IV SOLUTION) ×1 IMPLANT
YANKAUER SUCT BULB TIP NO VENT (SUCTIONS) ×2 IMPLANT

## 2012-07-21 NOTE — Interval H&P Note (Signed)
History and Physical Interval Note:  07/21/2012 11:59 AM  Karen Costa  has presented today for surgery, with the diagnosis of right breast calcifications  The various methods of treatment have been discussed with the patient and family. After consideration of risks, benefits and other options for treatment, the patient has consented to  Procedure(s) (LRB) with comments: BREAST BIOPSY WITH NEEDLE LOCALIZATION (Right) as a surgical intervention .  The patient's history has been reviewed, patient examined, no change in status, stable for surgery.  I have reviewed the patient's chart and labs.  Questions were answered to the patient's satisfaction.   I marked the right breast as the operative side  Karen Costa J

## 2012-07-21 NOTE — Transfer of Care (Signed)
Immediate Anesthesia Transfer of Care Note  Patient: Karen Costa  Procedure(s) Performed: Procedure(s) (LRB) with comments: BREAST BIOPSY WITH NEEDLE LOCALIZATION (Right)  Patient Location: PACU  Anesthesia Type: MAC  Level of Consciousness: awake, alert , oriented and patient cooperative  Airway & Oxygen Therapy: Patient Spontanous Breathing  Post-op Assessment: Report given to PACU RN and Post -op Vital signs reviewed and stable  Post vital signs: Reviewed and stable  Complications: No apparent anesthesia complications

## 2012-07-21 NOTE — Op Note (Signed)
QUANTERIA TEICHMAN  Jul 29, 1930  [id number]  07/21/2012   Preoperative diagnosis: Right breast calcifications, indeterminate, not amenable to NCB  Postoperative diagnosis: Same  Procedure: Wire localized excision of right breast calcifications  Surgeon: Currie Paris, MD, FACS  Anesthesia: MAC  Clinical History and Indications: this patient presents for a guidewire localized excision of a right breast area of indeterminate calcifications.  Description of procedure: The patient was seen in the holding area and the plans for the procedure reviewed. The right breast was marked as the operative side. The wire localizing films were reviewed.  The patient was taken to the operating room and after satisfactory general anesthesia had been obtained the right breast was prepped and draped and the timeout was performed.  The incision was made over the presumed area of the mass. The guidewire entered laterally and tracked superior and medialSkin flaps were raised and using cautery the area was completely excised. Bleeders were controlled with either cautery or sutures as needed.The breast was very dense. After achieving hemostasis, the incision was closed with 3-0 Vicryl, 4-0 Monocryl subcuticular, and Dermabond.  The patient tolerated the procedure well. There were no operative complications. All counts were correct.   Radiology reported the calcifications seen on specimen mammogram  EBL: Minimal  Currie Paris, MD, FACS 07/21/2012 12:53 PM

## 2012-07-21 NOTE — Anesthesia Preprocedure Evaluation (Addendum)
Anesthesia Evaluation  Patient identified by MRN, date of birth, ID band Patient awake    Reviewed: Allergy & Precautions, H&P , NPO status , Patient's Chart, lab work & pertinent test results  History of Anesthesia Complications Negative for: history of anesthetic complications  Airway Mallampati: II TM Distance: >3 FB Neck ROM: Full    Dental  (+) Teeth Intact and Dental Advisory Given   Pulmonary shortness of breath and with exertion, COPD COPD inhaler,    + wheezing      Cardiovascular negative cardio ROS      Neuro/Psych    GI/Hepatic Neg liver ROS, GERD-  Medicated and Controlled,  Endo/Other  negative endocrine ROS  Renal/GU negative Renal ROS     Musculoskeletal   Abdominal   Peds  Hematology   Anesthesia Other Findings   Reproductive/Obstetrics                           Anesthesia Physical Anesthesia Plan  ASA: III  Anesthesia Plan: MAC   Post-op Pain Management:    Induction: Intravenous  Airway Management Planned: Simple Face Mask  Additional Equipment:   Intra-op Plan:   Post-operative Plan:   Informed Consent: I have reviewed the patients History and Physical, chart, labs and discussed the procedure including the risks, benefits and alternatives for the proposed anesthesia with the patient or authorized representative who has indicated his/her understanding and acceptance.   Dental advisory given  Plan Discussed with:   Anesthesia Plan Comments:         Anesthesia Quick Evaluation

## 2012-07-21 NOTE — Anesthesia Procedure Notes (Signed)
Procedure Name: MAC Performed by: Meyer Russel Pre-anesthesia Checklist: Emergency Drugs available, Suction available, Patient being monitored and Patient identified Patient Re-evaluated:Patient Re-evaluated prior to inductionOxygen Delivery Method: Simple face mask Preoxygenation: Pre-oxygenation with 100% oxygen Intubation Type: IV induction

## 2012-07-21 NOTE — Anesthesia Postprocedure Evaluation (Signed)
Anesthesia Post Note  Patient: Karen Costa  Procedure(s) Performed: Procedure(s) (LRB): BREAST BIOPSY WITH NEEDLE LOCALIZATION (Right)  Anesthesia type: general  Patient location: PACU  Post pain: Pain level controlled  Post assessment: Patient's Cardiovascular Status Stable  Last Vitals:  Filed Vitals:   07/21/12 1315  BP: 134/65  Pulse: 74  Temp:   Resp: 20    Post vital signs: Reviewed and stable  Level of consciousness: sedated  Complications: No apparent anesthesia complications

## 2012-07-21 NOTE — H&P (View-Only) (Signed)
Patient ID: Karen Costa, female   DOB: 09/29/1930, 78 y.o.   MRN: [id number]  Chief Complaint  Patient presents with  . Breast Problem    new pt- eval rt breast calcifications     HPI Karen Costa is a 78 y.o. female. She is seen today for evaluation recommendations about a focal area of suspicious calcifications found on a recent mammogram. They're in the right breast. We were not amenable to a needle core biopsy. Radiologist recommended surgical excisional biopsy. She has no breast symptoms or other prior breast problems. She has good understanding of the issues involved from conversations with the radiologist. HPI  Past Medical History  Diagnosis Date  . GERD (gastroesophageal reflux disease)   . Bronchiectasis   . COPD (chronic obstructive pulmonary disease)   . Osteoporosis   . Breast calcifications     right breast    Past Surgical History  Procedure Date  . Tonsillectomy     Family History  Problem Relation Age of Onset  . Heart disease Father   . Cancer Father     colon  . Heart disease Brother   . Heart disease Sister   . Cancer Sister     colon  . Breast cancer Mother   . Cancer Mother     breast    Social History History  Substance Use Topics  . Smoking status: Former Smoker -- 25 years    Types: Cigarettes    Quit date: 10/05/1990  . Smokeless tobacco: Never Used  . Alcohol Use: Yes    No Known Allergies  Current Outpatient Prescriptions  Medication Sig Dispense Refill  . Calcium Carbonate-Vitamin D (CALCIUM + D) 600-200 MG-UNIT TABS Take 1 tablet by mouth 2 (two) times daily.       . fish oil-omega-3 fatty acids 1000 MG capsule Take 2 g by mouth daily.        . fluticasone (FLONASE) 50 MCG/ACT nasal spray Place into the nose. 2 sprays daily      . hydrocortisone 2.5 % cream as needed.      Marland Kitchen levofloxacin (LEVAQUIN) 750 MG tablet Take 750 mg by mouth as directed.      . Multiple Vitamins-Minerals (OCUVITE PRESERVISION) TABS Take by  mouth daily.        Marland Kitchen omeprazole (PRILOSEC) 20 MG capsule Take 20 mg by mouth daily.        Marland Kitchen PROVENTIL HFA 108 (90 BASE) MCG/ACT inhaler 2 PUFFS EVERY 4 HOURS AS NEEDED FOR SHORTNESS OF BREATH  8.5 each  6  . raloxifene (EVISTA) 60 MG tablet Take 60 mg by mouth daily.          Review of Systems Review of Systems  Constitutional: Negative for fever, chills and unexpected weight change.  HENT: Positive for hearing loss. Negative for congestion, sore throat, trouble swallowing and voice change.   Eyes: Negative for visual disturbance.  Respiratory: Positive for cough and wheezing.   Cardiovascular: Negative for chest pain, palpitations and leg swelling.  Gastrointestinal: Positive for diarrhea. Negative for nausea, vomiting, abdominal pain, constipation, blood in stool, abdominal distention and anal bleeding.  Genitourinary: Negative for hematuria, vaginal bleeding and difficulty urinating.  Musculoskeletal: Negative for arthralgias.  Skin: Negative for rash and wound.  Neurological: Negative for seizures, syncope and headaches.  Hematological: Negative for adenopathy. Bruises/bleeds easily.  Psychiatric/Behavioral: Negative for confusion.    Blood pressure 126/82, pulse 92, temperature 98.3 F (36.8 C), temperature source Temporal, resp. rate 20, height  5\' 3"  (1.6 m), weight 106 lb 6.4 oz (48.263 kg).  Physical Exam Physical Exam  Vitals reviewed. Constitutional: She is oriented to person, place, and time. She appears well-developed and well-nourished. No distress.  HENT:  Head: Normocephalic and atraumatic.  Mouth/Throat: Oropharynx is clear and moist.  Eyes: Conjunctivae normal and EOM are normal. Pupils are equal, round, and reactive to light. No scleral icterus.  Neck: Normal range of motion. Neck supple. No tracheal deviation present. No thyromegaly present.  Cardiovascular: Normal rate, regular rhythm, normal heart sounds and intact distal pulses.  Exam reveals no gallop and no  friction rub.   No murmur heard. Pulmonary/Chest: Effort normal and breath sounds normal. No respiratory distress. She has no wheezes. She has no rales.       The breasts are bilaterally symmetric. There is no palpable mass and no significant tenderness. There is small and somewhat irregular consistent with dense fibrocystic type breast changes. The skin of the nipple under real of normal.  There are a few scattered wheezes in her lungs. No respiratory distress however.  Abdominal: Soft. Bowel sounds are normal. She exhibits no distension and no mass. There is no tenderness. There is no rebound and no guarding.  Musculoskeletal: Normal range of motion. She exhibits no edema and no tenderness.  Lymphadenopathy:    She has no cervical adenopathy.    She has no axillary adenopathy.       Right axillary: No pectoral adenopathy present.       Right: No supraclavicular adenopathy present.  Neurological: She is alert and oriented to person, place, and time.  Skin: Skin is warm and dry. No rash noted. She is not diaphoretic. No erythema.  Psychiatric: She has a normal mood and affect. Her behavior is normal. Judgment and thought content normal.    Data Reviewed I have reviewed mammogram reports and old medical records  Assessment    Moderately suspicious calcifications right breast    Plan    Wire localized excisional biopsy. I have discussed the indications for the lumpectomy and described the procedure. She understand that the chance of removal of the abnormal area is very good, but that occasionally we are unable to locate it and may have to do a second procedure. We also discussed the possibility of a second procedure to get additional tissue. Risks of surgery such as bleeding and infection have also been explained, as well as the implications of not doing the surgery. She understands and wishes to proceed.        Neriah Brott J 06/27/2012, 1:04 PM

## 2012-07-22 ENCOUNTER — Encounter (HOSPITAL_BASED_OUTPATIENT_CLINIC_OR_DEPARTMENT_OTHER): Payer: Self-pay | Admitting: Surgery

## 2012-07-26 ENCOUNTER — Telehealth (INDEPENDENT_AMBULATORY_CARE_PROVIDER_SITE_OTHER): Payer: Self-pay | Admitting: General Surgery

## 2012-07-26 NOTE — Telephone Encounter (Signed)
Message copied by Liliana Cline on Tue Jul 26, 2012  8:19 AM ------      Message from: Currie Paris      Created: Tue Jul 26, 2012  7:20 AM       Tell her path is benign and as expected

## 2012-07-26 NOTE — Telephone Encounter (Signed)
Patient aware of pathology results. Will follow up at appt and call with any problems.

## 2012-07-26 NOTE — Telephone Encounter (Signed)
Left message on machine for patient to call back for path results. Pathology is benign and will be discussed in detail at her post operative appt.

## 2012-08-10 ENCOUNTER — Ambulatory Visit (INDEPENDENT_AMBULATORY_CARE_PROVIDER_SITE_OTHER): Payer: Medicare Other | Admitting: Surgery

## 2012-08-10 ENCOUNTER — Encounter (INDEPENDENT_AMBULATORY_CARE_PROVIDER_SITE_OTHER): Payer: Self-pay | Admitting: Surgery

## 2012-08-10 VITALS — BP 134/84 | HR 90 | Temp 98.0°F | Resp 18 | Ht 63.0 in | Wt 107.2 lb

## 2012-08-10 DIAGNOSIS — R921 Mammographic calcification found on diagnostic imaging of breast: Secondary | ICD-10-CM

## 2012-08-10 DIAGNOSIS — R928 Other abnormal and inconclusive findings on diagnostic imaging of breast: Secondary | ICD-10-CM

## 2012-08-10 NOTE — Progress Notes (Signed)
NAME: Karen Costa                                            DOB: 31-Dec-1929 DATE: 08/10/2012                                                  MRN: [id number]  CC: Post op   HPI: This patient comes in for post op follow-up .Sheunderwent wire localized excision of right breast calcifications on 07/21/2012. She feels that she is doing well.She has taken no pain meds since surgery.  PE:  VITAL SIGNS: BP 134/84  Pulse 90  Temp 98 F (36.7 C) (Oral)  Resp 18  Ht 5\' 3"  (1.6 m)  Wt 107 lb 3.2 oz (48.626 kg)  BMI 18.99 kg/m2  General: The patient appears to be healthy, NAD Breast: Incision healing nicely no infection or other problem  DATA REVIEWED: Pathology report: Breast, lumpectomy, right - BENIGN BREAST PARENCHYMA WITH COARSE STROMAL CALCIFICATIONS. - FIBROCYSTIC CHANGES WITH ASSOCIATED CALCIFICATIONS. - BENIGN DUCTS WITH ASSOCIATED CALCIFICATIONS. - NO ATYPIA, HYPERPLASIA OR MALIGNANCY IDENTIFIED.  IMPRESSION: The patient is doing well S/P NL lumpectomy.    PLAN: RTC PRN Normal activities. Gave her a copy of the path report and reviewed it with her

## 2012-08-10 NOTE — Patient Instructions (Signed)
We will see you again on an as needed basis. Please call the office at [phone] if you have any questions or concerns. Thank you for allowing us to take care of you.  

## 2012-08-18 ENCOUNTER — Other Ambulatory Visit: Payer: Self-pay | Admitting: Internal Medicine

## 2012-08-18 DIAGNOSIS — R1011 Right upper quadrant pain: Secondary | ICD-10-CM

## 2012-08-22 ENCOUNTER — Ambulatory Visit
Admission: RE | Admit: 2012-08-22 | Discharge: 2012-08-22 | Disposition: A | Discharge status: Home / Self Care | Payer: Medicare Other | Source: Ambulatory Visit | Attending: Internal Medicine | Admitting: Internal Medicine

## 2012-08-22 DIAGNOSIS — R1011 Right upper quadrant pain: Secondary | ICD-10-CM

## 2012-10-12 ENCOUNTER — Ambulatory Visit: Payer: Medicare Other | Admitting: Emergency Medicine

## 2012-10-26 ENCOUNTER — Ambulatory Visit: Payer: Medicare Other | Admitting: Emergency Medicine

## 2012-11-28 ENCOUNTER — Ambulatory Visit (INDEPENDENT_AMBULATORY_CARE_PROVIDER_SITE_OTHER): Payer: Medicare Other | Admitting: Emergency Medicine

## 2012-11-28 ENCOUNTER — Encounter: Payer: Self-pay | Admitting: Emergency Medicine

## 2012-11-28 VITALS — BP 146/70 | HR 99 | Temp 97.6°F | Ht 63.0 in | Wt 109.0 lb

## 2012-11-28 DIAGNOSIS — J449 Chronic obstructive pulmonary disease, unspecified: Secondary | ICD-10-CM

## 2012-11-28 DIAGNOSIS — J984 Other disorders of lung: Secondary | ICD-10-CM

## 2012-11-28 DIAGNOSIS — J479 Bronchiectasis, uncomplicated: Secondary | ICD-10-CM

## 2012-11-28 NOTE — Progress Notes (Signed)
  Subjective:    Patient ID: Karen Costa, female    DOB: [birth year], 79 y.o.   MRN: [id number]  HPI Karen Costa is a 79 year old woman who follows up today for her COPD and bronchiectasis.  She also has a history of a right sided pulmonary nodule. her last CT Scan, 11/08, showed stable RLL scar, also seen on f/u CT scan 6/12. . No longer uses spiriva or advair. Last seen 6/13. She remains very active, no breathing limitations. She uses albuterol prn, about once a week. Cough under good control, allergies under good control. She is interested in Oralair > a sublingual allergy med, against 5 of th most common grasses.         Objective:   Physical Exam Filed Vitals:   11/28/12 1548  BP: 146/70  Pulse: 99  Temp: 97.6 F (36.4 C)   Gen: Pleasant, thin, in no distress,  normal affect  ENT: No lesions,  mouth clear,  oropharynx clear, no postnasal drip  Neck: No JVD, no TMG, no carotid bruits  Lungs: coarse B, rhonchi, no wheeze. Few insp squeeks  Cardiovascular: RRR, heart sounds normal, no murmur or gallops, no peripheral edema  Musculoskeletal: No deformities, no cyanosis or clubbing  Neuro: alert, non focal  Skin: Warm, no lesions or rashes     CT chest 03/11/11  Comparison: 08/25/2007.  Findings: Lung windows demonstrate similar narrowing and  irregularity of the lobar and segmental bronchi to the right middle  lobe on image 28 of series 3.  Pleural parenchymal scarring at the right apex is unchanged.  Diffuse interstitial and reticular nodular opacities are similar.  Some areas demonstrate minimal progression. For example, a  subpleural interstitial opacity in the right lower lobe on image 39  series 3 is new. A subpleural 4 mm nodular density in the left  upper lobe on image 29 is new.  There is no dominant right upper lobe nodule or mass identified.  Bronchiectasis in the right middle lobe with volume loss is  similar. Probable scarring in the subpleural left  lower lobe at 9  mm on image 39.  Soft tissue windows demonstrate cardiomegaly accentuated by pectus  deformity. No pericardial or pleural effusion. Multivessel  coronary artery atherosclerosis. No mediastinal or definite hilar  adenopathy, given limitations of unenhanced CT.  Limited abdominal imaging demonstrates right hepatic lobe cyst. No  significant findings. No acute osseous abnormality. Exaggeration  of thoracic kyphosis.  IMPRESSION:  1. Similar to minimal increase in relatively diffuse interstitial  and reticular nodular opacities. Concurrent right middle lobe  bronchiectasis and volume loss. 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  2. No evidence of dominant right upper lobe pulmonary nodule or  mass.   Assessment & Plan:  LUNG NODULE Stable scar on serial CT scans, none since 03/2011  COPD Albuterol prn  BRONCHIECTASIS WITHOUT ACUTE EXACERBATION No recent exacerbations.  - albuterol prn - manage allergies > she is interested in [place], which is an oral med against the 5 main grasses; would need to go to an allergist to have IgE's or skin testing. Wants to defer for now but may want allergy referral when her home issues calm down

## 2012-11-28 NOTE — Assessment & Plan Note (Signed)
No recent exacerbations.  - albuterol prn - manage allergies > she is interested in [place], which is an oral med against the 5 main grasses; would need to go to an allergist to have IgE's or skin testing. Wants to defer for now but may want allergy referral when her home issues calm down

## 2012-11-28 NOTE — Assessment & Plan Note (Signed)
Stable scar on serial CT scans, none since 03/2011

## 2012-11-28 NOTE — Patient Instructions (Addendum)
Please continue your current medications as you are taking them  Follow with Dr Delton Coombes in 6 months or sooner if you have any problems Call our office if and when you are interested in seeing an allergist.

## 2012-11-28 NOTE — Assessment & Plan Note (Signed)
Albuterol prn

## 2013-01-26 ENCOUNTER — Institutional Professional Consult (permissible substitution): Payer: Medicare Other | Admitting: Internal Medicine

## 2013-03-09 ENCOUNTER — Other Ambulatory Visit: Payer: Self-pay | Admitting: Emergency Medicine

## 2013-04-11 ENCOUNTER — Ambulatory Visit (INDEPENDENT_AMBULATORY_CARE_PROVIDER_SITE_OTHER): Payer: Medicare Other | Admitting: Pulmonary Disease

## 2013-04-11 ENCOUNTER — Encounter: Payer: Self-pay | Admitting: Pulmonary Disease

## 2013-04-11 VITALS — BP 128/80 | HR 87 | Temp 98.0°F | Ht 63.0 in | Wt 105.2 lb

## 2013-04-11 DIAGNOSIS — J449 Chronic obstructive pulmonary disease, unspecified: Secondary | ICD-10-CM

## 2013-04-11 DIAGNOSIS — J4489 Other specified chronic obstructive pulmonary disease: Secondary | ICD-10-CM

## 2013-04-11 DIAGNOSIS — J479 Bronchiectasis, uncomplicated: Secondary | ICD-10-CM

## 2013-04-11 NOTE — Assessment & Plan Note (Signed)
The patient is not having any significant increase in chest congestion or cough with purulence.

## 2013-04-11 NOTE — Assessment & Plan Note (Signed)
The patient has had some increased dyspnea on exertion, and does respond to as needed albuterol.  She is very concerned because she wants to stay active in her garden and also to care for her disabled husband.  She does not have active wheezing on exam today and is moving adequate air, but she clearly does have some rhonchi.  I would like to try her on a maintenance bronchodilator regimen with an inhaled corticosteroid to see if she has improvement in her daily symptoms.

## 2013-04-11 NOTE — Progress Notes (Signed)
  Subjective:    Patient ID: Karen Costa, female    DOB: 04-02-1930, 79 y.o.   MRN: [id number]  HPI The patient comes in today for an acute sick visit.  She has known bronchiectasis with underlying obstructive lung disease, and in the past has done well on albuterol as needed.  She stays very active with gardening, and also taking care of her husband.  She has noticed some decreased exercise tolerance over time, but the last few weeks she has required more frequent use of her albuterol which definitely helps.  She has not had any chest congestion, increased mucus or purulence.  Her shortness of breath is not significant enough to keep her from doing things, but it does interfere with her endurance and more physical activities.   Review of Systems  Constitutional: Negative for fever and unexpected weight change.  HENT: Negative for ear pain, nosebleeds, congestion, sore throat, rhinorrhea, sneezing, trouble swallowing, dental problem, postnasal drip and sinus pressure.   Eyes: Negative for redness and itching.  Respiratory: Positive for cough ( clear mucus only in mornings) and shortness of breath. Negative for chest tightness and wheezing.   Cardiovascular: Negative for palpitations and leg swelling.  Gastrointestinal: Negative for nausea and vomiting.  Genitourinary: Negative for dysuria.  Musculoskeletal: Negative for joint swelling.  Skin: Negative for rash.  Neurological: Negative for headaches.  Hematological: Does not bruise/bleed easily.  Psychiatric/Behavioral: Negative for dysphoric mood. The patient is not nervous/anxious.        Objective:   Physical Exam Thin female in no acute distress Nose without purulence or discharge noted Oropharynx clear Neck without lymphadenopathy or thyromegaly Chest with rhonchi noted throughout, no active wheezing, a few scattered crackles. Cardiac exam with regular rate and rhythm Lower extremities with no edema, no cyanosis Alert and  oriented, moves all 4 extremities.       Assessment & Plan:

## 2013-04-11 NOTE — Patient Instructions (Addendum)
Will try you on a maintenance inhaler for the next 3 weeks.  Take symbicort 160/4.5  2 inhalations am and pm everyday no matter what.  Rinse mouth well after using and gargle. Please call us in 3 weeks and give update with how things are going.  If it helps you significantly, will continue.  If not, can discuss further options.  Keep followup with Dr. Delton Coombes.

## 2013-05-02 ENCOUNTER — Telehealth: Payer: Self-pay | Admitting: Emergency Medicine

## 2013-05-02 MED ORDER — BUDESONIDE-FORMOTEROL FUMARATE 160-4.5 MCG/ACT IN AERO: 2.0000 | INHALATION_SPRAY | Freq: Two times a day (BID) | RESPIRATORY_TRACT | Status: DC

## 2013-05-02 NOTE — Telephone Encounter (Signed)
Per KC last note: Will try you on a maintenance inhaler for the next 3 weeks. Take symbicort 160/4.5 2 inhalations am and pm everyday no matter what. Rinse mouth well after using and gargle.  Please call us in 3 weeks and give update with how things are going. If it helps you significantly, will continue. If not, can discuss further options.   Pt states symbicort is working very well and requests rx. Rx sent. Carron Curie, CMA

## 2013-07-12 ENCOUNTER — Other Ambulatory Visit: Payer: Self-pay

## 2013-07-12 DIAGNOSIS — Z1231 Encounter for screening mammogram for malignant neoplasm of breast: Secondary | ICD-10-CM

## 2013-08-10 ENCOUNTER — Ambulatory Visit
Admission: RE | Admit: 2013-08-10 | Discharge: 2013-08-10 | Disposition: A | Discharge status: Home / Self Care | Payer: Medicare Other | Source: Ambulatory Visit

## 2013-08-10 DIAGNOSIS — Z1231 Encounter for screening mammogram for malignant neoplasm of breast: Secondary | ICD-10-CM

## 2013-08-23 ENCOUNTER — Other Ambulatory Visit: Payer: Self-pay | Admitting: Internal Medicine

## 2013-08-23 DIAGNOSIS — R1011 Right upper quadrant pain: Secondary | ICD-10-CM

## 2013-08-29 ENCOUNTER — Ambulatory Visit
Admission: RE | Admit: 2013-08-29 | Discharge: 2013-08-29 | Disposition: A | Discharge status: Home / Self Care | Payer: Medicare Other | Source: Ambulatory Visit | Attending: Internal Medicine | Admitting: Internal Medicine

## 2013-08-29 DIAGNOSIS — R1011 Right upper quadrant pain: Secondary | ICD-10-CM

## 2013-12-04 ENCOUNTER — Other Ambulatory Visit: Payer: Self-pay | Admitting: Emergency Medicine

## 2014-04-03 ENCOUNTER — Ambulatory Visit (INDEPENDENT_AMBULATORY_CARE_PROVIDER_SITE_OTHER): Payer: Medicare Other | Admitting: Internal Medicine

## 2014-04-03 ENCOUNTER — Encounter: Payer: Self-pay | Admitting: Internal Medicine

## 2014-04-03 ENCOUNTER — Ambulatory Visit: Payer: Medicare Other | Admitting: Pulmonary Disease

## 2014-04-03 VITALS — BP 134/80 | HR 99 | Temp 98.1°F | Ht 63.0 in | Wt 102.0 lb

## 2014-04-03 DIAGNOSIS — J471 Bronchiectasis with (acute) exacerbation: Secondary | ICD-10-CM

## 2014-04-03 MED ORDER — LEVOFLOXACIN 750 MG PO TABS
750.0000 mg | ORAL_TABLET | Freq: Every day | ORAL | Status: DC
Start: 2014-04-03 — End: 2014-06-01

## 2014-04-03 MED ORDER — PREDNISONE 10 MG PO TABS: ORAL_TABLET | ORAL | Status: DC

## 2014-04-03 NOTE — Progress Notes (Signed)
Subjective:     Patient ID: Karen Costa, female   DOB: 06-21-1930, 80 y.o.   MRN: [id number]  HPI  [id number] yowf quit smoking around 1990s with dx of bronchiectasis/ copd followed by Dr Delton CoombesByrum   04/03/2014 acute  ov/Wert re: cough/sob on symbicort 160 but poor hfa  Chief Complaint  Patient presents with  . Acute Visit    Pt states she has an increase in dyspnea, productive cough with clear mucous, PND and rhinnorhea x 2 weeks. Pt states the heat has made her breathing worse. Denies CP.    gradual onset sob assoc with cough and nasal symptoms, comfortable at rest but sob > alds  No obvious day to day or daytime variabilty or assoc  cp or chest tightness, subjective wheeze overt   hb symptoms. No unusual exp hx or h/o childhood pna/ asthma or knowledge of premature birth.  Sleeping ok without nocturnal  or early am exacerbation  of respiratory  c/o's or need for noct saba. Also denies any obvious fluctuation of symptoms with weather or environmental changes or other aggravating or alleviating factors except as outlined above   Current Medications, Allergies, Complete Past Medical History, Past Surgical History, Family History, and Social History were reviewed in [facility] electronic medical record.  ROS  The following are not active complaints unless bolded sore throat, dysphagia, dental problems, itching, sneezing,  nasal congestion or excess/ purulent secretions, ear ache,   fever, chills, sweats, unintended wt loss, pleuritic or exertional cp, hemoptysis,  orthopnea pnd or leg swelling, presyncope, palpitations, heartburn, abdominal pain, anorexia, nausea, vomiting, diarrhea  or change in bowel or urinary habits, change in stools or urine, dysuria,hematuria,  rash, arthralgias, visual complaints, headache, numbness weakness or ataxia or problems with walking or coordination,  change in mood/affect or memory.        Review of Systems     Objective:   Physical Exam    amb wf  nad  Wt Readings from Last 3 Encounters:  04/03/14 102 lb (46.267 kg)  04/11/13 105 lb 3.2 oz (47.718 kg)  11/28/12 109 lb (49.442 kg)     HEENT mild turbinate edema.  Oropharynx no thrush or excess pnd or cobblestoning.  No JVD or cervical adenopathy. Mild accessory muscle hypertrophy. Trachea midline, nl thryroid. Chest was hyperinflated by percussion with diminished breath sounds and moderate increased exp time without wheeze. Hoover sign positive at mid inspiration. Regular rate and rhythm without murmur gallop or rub or increase P2 or edema.  Abd: no hsm, nl excursion. Ext warm without cyanosis or clubbing.      Assessment:

## 2014-04-03 NOTE — Patient Instructions (Signed)
Work on inhaler technique:  relax and gently blow all the way out then take a nice smooth deep breath back in, triggering the inhaler at same time you start breathing in.  Hold for up to 5 seconds if you can.  Rinse and gargle with water when done  Prednisone 10 mg take  4 each am x 2 days,   2 each am x 2 days,  1 each am x 2 days and stop  If mucus turns nasty > levaquin 750 mg x 5 days   Prevnar today   Please schedule a follow up office visit in 4 weeks, sooner if needed to see Dr Delton CoombesByrum

## 2014-04-05 NOTE — Assessment & Plan Note (Addendum)
DDX of  difficult airways management all start with A and  include Adherence, Ace Inhibitors, Acid Reflux, Active Sinus Disease, Alpha 1 Antitripsin deficiency, Anxiety masquerading as Airways dz,  ABPA,  allergy(esp in young), Aspiration (esp in elderly), Adverse effects of DPI,  Active smokers, plus two Bs  = Bronchiectasis and Beta blocker use..and one C= CHF  She has known bronchiectasis with apparent component of airflow obstruction.  The proper method of use, as well as anticipated side effects, of a metered-dose inhaler are discussed and demonstrated to the patient. Improved effectiveness after extensive coaching during this visit to a level of approximately  75% so continue symbicort - Not enough purulent sputum at this point to warrant levaquin but will give refillable keep on hand in case she needs it.   ? Acid (or non-acid) GERD > always difficult to exclude as up to 75% of pts in some series report no assoc GI/ Heartburn symptoms> rec ppi ac for acid suppression   ? Active sinus dz > continue flonase     Prevnar given today    Each maintenance medication was reviewed in detail including most importantly the difference between maintenance and as needed and under what circumstances the prns are to be used.  Please see instructions for details which were reviewed in writing and the patient given a copy.

## 2014-05-10 ENCOUNTER — Encounter: Payer: Self-pay | Admitting: Emergency Medicine

## 2014-05-10 ENCOUNTER — Ambulatory Visit (INDEPENDENT_AMBULATORY_CARE_PROVIDER_SITE_OTHER): Payer: Medicare Other | Admitting: Emergency Medicine

## 2014-05-10 VITALS — BP 140/70 | HR 111 | Temp 97.8°F | Ht 63.0 in | Wt 101.0 lb

## 2014-05-10 DIAGNOSIS — J471 Bronchiectasis with (acute) exacerbation: Secondary | ICD-10-CM

## 2014-05-10 NOTE — Patient Instructions (Signed)
Please take another round of levaquin for 5 days.  Continue your symbicort 2 puffs twice a day Use albuterol 2 puffs as needed for shortness of breath Follow with Dr Delton CoombesByrum in 2 months or sooner if you have any problems.

## 2014-05-10 NOTE — Assessment & Plan Note (Signed)
Bronchiectasis with a recent exacerbation. She still has yellow mucous although breathing sounds better. She tolerated the pred poorly. She took levaquin, wonders if she needed 7 days instead of 5 - repeat course of Levaquin x 5 days - same BD regimen - flu shot in the fall - return office visit 2

## 2014-05-10 NOTE — Progress Notes (Signed)
  Subjective:   Patient ID: Karen Costa, female    DOB: January 27, 1930, 80 y.o.   MRN: [id number]  HPI Karen Costa is a 80 year old woman who follows for COPD and bronchiectasis.  She also has a history of a right sided pulmonary nodule. her last CT Scan, 11/08, showed stable RLL scar, also seen on f/u CT scan 6/12. . No longer uses spiriva or advair. Last seen 6/13. She remains very active, no breathing limitations. She uses albuterol prn, about once a week. Cough under good control, allergies under good control. She is interested in Oralair > a sublingual allergy med, against 5 of th most common grasses.   ROV 05/10/14 -- hx COPD and bronchiectasis, allergies, nodular disease. She has been seen twice since our last visit for AE, last treated with levaquin x 5 days. She was primarily having dyspnea, but now also with some yellow thick mucous.         Objective:   Physical Exam Filed Vitals:   05/10/14 1448  BP: 140/70  Pulse: 111  Temp: 97.8 F (36.6 C)   Gen: Pleasant, thin, in no distress,  normal affect  ENT: No lesions,  mouth clear,  oropharynx clear, no postnasal drip  Neck: No JVD, no TMG, no carotid bruits  Lungs: coarse B, rhonchi, no wheeze. Few insp squeeks  Cardiovascular: RRR, heart sounds normal, no murmur or gallops, no peripheral edema  Musculoskeletal: No deformities, no cyanosis or clubbing  Neuro: alert, non focal  Skin: Warm, no lesions or rashes     CT chest 03/11/11  Comparison: 08/25/2007.  Findings: Lung windows demonstrate similar narrowing and  irregularity of the lobar and segmental bronchi to the right middle  lobe on image 28 of series 3.  Pleural parenchymal scarring at the right apex is unchanged.  Diffuse interstitial and reticular nodular opacities are similar.  Some areas demonstrate minimal progression. For example, a  subpleural interstitial opacity in the right lower lobe on image 39  series 3 is new. A subpleural 4 mm nodular density  in the left  upper lobe on image 29 is new.  There is no dominant right upper lobe nodule or mass identified.  Bronchiectasis in the right middle lobe with volume loss is  similar. Probable scarring in the subpleural left lower lobe at 9  mm on image 39.  Soft tissue windows demonstrate cardiomegaly accentuated by pectus  deformity. No pericardial or pleural effusion. Multivessel  coronary artery atherosclerosis. No mediastinal or definite hilar  adenopathy, given limitations of unenhanced CT.  Limited abdominal imaging demonstrates right hepatic lobe cyst. No  significant findings. No acute osseous abnormality. Exaggeration  of thoracic kyphosis.  IMPRESSION:  1. Similar to minimal increase in relatively diffuse interstitial  and reticular nodular opacities. Concurrent right middle lobe  bronchiectasis and volume loss. 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  2. No evidence of dominant right upper lobe pulmonary nodule or  mass.   Assessment & Plan:  BRONCHIECTASIS WITHOUT ACUTE EXACERBATION Bronchiectasis with a recent exacerbation. She still has yellow mucous although breathing sounds better. She tolerated the pred poorly. She took levaquin, wonders if she needed 7 days instead of 5 - repeat course of Levaquin x 5 days - same BD regimen - flu shot in the fall - return office visit 2

## 2014-06-01 ENCOUNTER — Telehealth: Payer: Self-pay | Admitting: Emergency Medicine

## 2014-06-01 MED ORDER — LEVOFLOXACIN 750 MG PO TABS: 750.0000 mg | ORAL_TABLET | Freq: Every day | ORAL | Status: DC

## 2014-06-01 NOTE — Telephone Encounter (Signed)
Called and spoke to pt. Pt c/o increase in SOB, prod cough that just became thick brown mucous this morning. Pt denies CP/tightness, wheezing, orthopnea, f/c/s, swelling and PND. Pt already has 5 day Levaquin refill at pharmacy, pt was requesting 7 day Levaquin script. Pt is using her symbicort as prescribed, pt is not taking a nasal steroid that is on her med list. Pt last seen on 05/10/2014.  RB please advise.

## 2014-06-01 NOTE — Telephone Encounter (Signed)
Called and spoke with pt  And she is aware of RB recs and that the rx for the levaquin #2  Has been sent to the pharmacy.

## 2014-06-01 NOTE — Telephone Encounter (Signed)
Send her 2 more days of levaquin. Ask her to restart her nasal steroid spray

## 2014-06-10 ENCOUNTER — Encounter (HOSPITAL_COMMUNITY): Payer: Self-pay | Admitting: Emergency Medicine

## 2014-06-10 ENCOUNTER — Emergency Department (HOSPITAL_COMMUNITY): Payer: Medicare Other

## 2014-06-10 ENCOUNTER — Inpatient Hospital Stay (HOSPITAL_COMMUNITY)
Admission: EM | Admit: 2014-06-10 | Discharge: 2014-06-13 | DRG: 191 | Disposition: A | Discharge status: Home / Self Care | Payer: Medicare Other | Attending: Pulmonary Disease | Admitting: Pulmonary Disease

## 2014-06-10 DIAGNOSIS — Z87891 Personal history of nicotine dependence: Secondary | ICD-10-CM | POA: Diagnosis not present

## 2014-06-10 DIAGNOSIS — J471 Bronchiectasis with (acute) exacerbation: Secondary | ICD-10-CM | POA: Diagnosis present

## 2014-06-10 DIAGNOSIS — R0602 Shortness of breath: Secondary | ICD-10-CM | POA: Diagnosis not present

## 2014-06-10 DIAGNOSIS — R921 Mammographic calcification found on diagnostic imaging of breast: Secondary | ICD-10-CM

## 2014-06-10 DIAGNOSIS — J449 Chronic obstructive pulmonary disease, unspecified: Secondary | ICD-10-CM

## 2014-06-10 DIAGNOSIS — M129 Arthropathy, unspecified: Secondary | ICD-10-CM | POA: Diagnosis present

## 2014-06-10 DIAGNOSIS — J984 Other disorders of lung: Secondary | ICD-10-CM

## 2014-06-10 DIAGNOSIS — K219 Gastro-esophageal reflux disease without esophagitis: Secondary | ICD-10-CM | POA: Diagnosis present

## 2014-06-10 DIAGNOSIS — J4489 Other specified chronic obstructive pulmonary disease: Secondary | ICD-10-CM

## 2014-06-10 DIAGNOSIS — Z79899 Other long term (current) drug therapy: Secondary | ICD-10-CM

## 2014-06-10 DIAGNOSIS — J441 Chronic obstructive pulmonary disease with (acute) exacerbation: Secondary | ICD-10-CM | POA: Diagnosis not present

## 2014-06-10 DIAGNOSIS — R079 Chest pain, unspecified: Secondary | ICD-10-CM

## 2014-06-10 DIAGNOSIS — J309 Allergic rhinitis, unspecified: Secondary | ICD-10-CM

## 2014-06-10 LAB — COMPREHENSIVE METABOLIC PANEL
ALT: 13 U/L (ref 0–35)
AST: 17 U/L (ref 0–37)
Albumin: 3.9 g/dL (ref 3.5–5.2)
Alkaline Phosphatase: 93 U/L (ref 39–117)
Anion gap: 16 — ABNORMAL HIGH (ref 5–15)
BILIRUBIN TOTAL: 0.3 mg/dL (ref 0.3–1.2)
BUN: 13 mg/dL (ref 6–23)
CALCIUM: 10.2 mg/dL (ref 8.4–10.5)
CHLORIDE: 101 meq/L (ref 96–112)
CO2: 25 meq/L (ref 19–32)
CREATININE: 0.49 mg/dL — AB (ref 0.50–1.10)
GFR, EST NON AFRICAN AMERICAN: 88 mL/min — AB (ref >90)
GLUCOSE: 136 mg/dL — AB (ref 70–99)
Potassium: 3.9 mEq/L (ref 3.7–5.3)
Sodium: 142 mEq/L (ref 137–147)
Total Protein: 7.9 g/dL (ref 6.0–8.3)

## 2014-06-10 LAB — CBC WITH DIFFERENTIAL/PLATELET
BASOS ABS: 0 10*3/uL (ref 0.0–0.1)
Basophils Relative: 0 % (ref 0–1)
EOS PCT: 0 % (ref 0–5)
Eosinophils Absolute: 0 10*3/uL (ref 0.0–0.7)
HEMATOCRIT: 42.3 % (ref 36.0–46.0)
HEMOGLOBIN: 14.3 g/dL (ref 12.0–15.0)
LYMPHS ABS: 1.2 10*3/uL (ref 0.7–4.0)
LYMPHS PCT: 12 % (ref 12–46)
MCH: 31.1 pg (ref 26.0–34.0)
MCHC: 33.8 g/dL (ref 30.0–36.0)
MCV: 92 fL (ref 78.0–100.0)
MONO ABS: 0.8 10*3/uL (ref 0.1–1.0)
MONOS PCT: 8 % (ref 3–12)
NEUTROS ABS: 7.5 10*3/uL (ref 1.7–7.7)
Neutrophils Relative %: 80 % — ABNORMAL HIGH (ref 43–77)
Platelets: 265 10*3/uL (ref 150–400)
RBC: 4.6 MIL/uL (ref 3.87–5.11)
RDW: 13.8 % (ref 11.5–15.5)
WBC: 9.4 10*3/uL (ref 4.0–10.5)

## 2014-06-10 LAB — TROPONIN I

## 2014-06-10 MED ORDER — ACETAMINOPHEN 650 MG RE SUPP: 650.0000 mg | Freq: Four times a day (QID) | RECTAL | Status: DC | PRN

## 2014-06-10 MED ORDER — LEVOFLOXACIN IN D5W 500 MG/100ML IV SOLN
500.0000 mg | Freq: Once | INTRAVENOUS | Status: AC
  Administered 2014-06-10: 500 mg via INTRAVENOUS
  Filled 2014-06-10: qty 100

## 2014-06-10 MED ORDER — METHYLPREDNISOLONE SODIUM SUCC 125 MG IJ SOLR
60.0000 mg | INTRAMUSCULAR | Status: DC
  Administered 2014-06-11: 60 mg via INTRAVENOUS
  Filled 2014-06-10: qty 0.96

## 2014-06-10 MED ORDER — SODIUM CHLORIDE 0.9 % IV SOLN
INTRAVENOUS | Status: DC
  Administered 2014-06-10: 500 mL via INTRAVENOUS
  Administered 2014-06-11 (×3): via INTRAVENOUS

## 2014-06-10 MED ORDER — ACETAMINOPHEN 325 MG PO TABS: 650.0000 mg | ORAL_TABLET | Freq: Four times a day (QID) | ORAL | Status: DC | PRN

## 2014-06-10 MED ORDER — DEXTROSE 5 % IV SOLN
1.0000 g | Freq: Three times a day (TID) | INTRAVENOUS | Status: DC
  Administered 2014-06-11 – 2014-06-13 (×8): 1 g via INTRAVENOUS
  Filled 2014-06-10 (×9): qty 1

## 2014-06-10 MED ORDER — HEPARIN SODIUM (PORCINE) 5000 UNIT/ML IJ SOLN
5000.0000 [IU] | Freq: Three times a day (TID) | INTRAMUSCULAR | Status: DC
  Administered 2014-06-10 – 2014-06-13 (×8): 5000 [IU] via SUBCUTANEOUS
  Filled 2014-06-10 (×11): qty 1

## 2014-06-10 MED ORDER — CEFTAZIDIME 1 G IJ SOLR: 1.0000 g | Freq: Once | INTRAMUSCULAR | Status: DC

## 2014-06-10 MED ORDER — BUDESONIDE-FORMOTEROL FUMARATE 160-4.5 MCG/ACT IN AERO
2.0000 | INHALATION_SPRAY | Freq: Two times a day (BID) | RESPIRATORY_TRACT | Status: DC
  Administered 2014-06-11 – 2014-06-13 (×5): 2 via RESPIRATORY_TRACT
  Filled 2014-06-10: qty 6

## 2014-06-10 MED ORDER — METHYLPREDNISOLONE SODIUM SUCC 125 MG IJ SOLR
125.0000 mg | Freq: Once | INTRAMUSCULAR | Status: AC
  Administered 2014-06-10: 125 mg via INTRAVENOUS
  Filled 2014-06-10: qty 2

## 2014-06-10 MED ORDER — FLUTICASONE PROPIONATE 50 MCG/ACT NA SUSP
2.0000 | Freq: Every day | NASAL | Status: DC
  Administered 2014-06-11 – 2014-06-13 (×3): 2 via NASAL
  Filled 2014-06-10: qty 16

## 2014-06-10 MED ORDER — LEVOFLOXACIN 500 MG PO TABS
500.0000 mg | ORAL_TABLET | Freq: Every day | ORAL | Status: DC
  Administered 2014-06-11 – 2014-06-13 (×3): 500 mg via ORAL
  Filled 2014-06-10 (×3): qty 1

## 2014-06-10 MED ORDER — ALBUTEROL SULFATE (2.5 MG/3ML) 0.083% IN NEBU: 2.5000 mg | INHALATION_SOLUTION | RESPIRATORY_TRACT | Status: DC | PRN

## 2014-06-10 MED ORDER — ASPIRIN EC 81 MG PO TBEC
81.0000 mg | DELAYED_RELEASE_TABLET | Freq: Every day | ORAL | Status: DC
  Administered 2014-06-11 – 2014-06-13 (×3): 81 mg via ORAL
  Filled 2014-06-10 (×3): qty 1

## 2014-06-10 MED ORDER — PANTOPRAZOLE SODIUM 40 MG PO TBEC
40.0000 mg | DELAYED_RELEASE_TABLET | Freq: Every day | ORAL | Status: DC
  Administered 2014-06-10 – 2014-06-13 (×4): 40 mg via ORAL
  Filled 2014-06-10 (×5): qty 1

## 2014-06-10 MED ORDER — DEXTROSE 5 % IV SOLN
1.0000 g | INTRAVENOUS | Status: AC
  Administered 2014-06-10: 1 g via INTRAVENOUS
  Filled 2014-06-10: qty 1

## 2014-06-10 MED ORDER — VANCOMYCIN HCL IN DEXTROSE 1-5 GM/200ML-% IV SOLN
1000.0000 mg | Freq: Once | INTRAVENOUS | Status: AC
  Administered 2014-06-10: 1000 mg via INTRAVENOUS
  Filled 2014-06-10: qty 200

## 2014-06-10 MED ORDER — TRIAMCINOLONE ACETONIDE 55 MCG/ACT NA AERO: 2.0000 | INHALATION_SPRAY | Freq: Every day | NASAL | Status: DC

## 2014-06-10 MED ORDER — SODIUM CHLORIDE 0.9 % IV BOLUS (SEPSIS)
500.0000 mL | Freq: Once | INTRAVENOUS | Status: AC
  Administered 2014-06-10: 500 mL via INTRAVENOUS

## 2014-06-10 MED ORDER — ALBUTEROL SULFATE (2.5 MG/3ML) 0.083% IN NEBU
5.0000 mg | INHALATION_SOLUTION | Freq: Once | RESPIRATORY_TRACT | Status: AC
  Administered 2014-06-10: 2.5 mg via RESPIRATORY_TRACT
  Filled 2014-06-10: qty 6

## 2014-06-10 MED ORDER — ALBUTEROL SULFATE (2.5 MG/3ML) 0.083% IN NEBU
2.5000 mg | INHALATION_SOLUTION | RESPIRATORY_TRACT | Status: DC
  Administered 2014-06-11 (×4): 2.5 mg via RESPIRATORY_TRACT
  Filled 2014-06-10 (×4): qty 3

## 2014-06-10 NOTE — Plan of Care (Signed)
Problem: Consults Goal: COPD Patient Education (See Patient Education Module for education specifics.)  Outcome: Progressing Packet given on admission & went over things with pt, but the pt was disinterested, due to the fact that she had read multi stuff on COPD over the years.

## 2014-06-10 NOTE — H&P (Signed)
PULMONARY / CRITICAL CARE MEDICINE   Name: Karen Costa MRN: [id number] DOB: 26-Sep-1930    ADMISSION DATE:  06/10/2014 CONSULTATION DATE:  06/10/2014    REFERRING MD :  Dr. Rosalia Hammers  CHIEF COMPLAINT:  Shortness of breath  INITIAL PRESENTATION: Shortness of breath and cough worsening for 48 hours.   STUDIES:  CXR - RUL infiltrate  SIGNIFICANT EVENTS: Admission 9/6   HISTORY OF PRESENT ILLNESS:  80 y/o woman with past medical history of bronchiectasis and COPD who has been treated with levaquin in the last week for COPD exacerbation developed worsening cough and shortness of breath over the last 48 hours.  Initially her symptoms had improved with levaquin, sputum had cleared, however over the last 2 days cough has worsened again, sputum has gone from clear to tan and she has felt more short of breath.  She has not had any fever, chills, night sweats, nausea or vomiting.  She denies any chest pain.  She did not feel like she was wheezing, she just felt short of breath.  She does not have a history of aspiration and denies any recent dysphagia.  She does have GERD for which she takes prilosec daily, this has been well controlled recently.  She has not had any leg swelling or tenderness.   PAST MEDICAL HISTORY :  Past Medical History  Diagnosis Date  . GERD (gastroesophageal reflux disease)   . Bronchiectasis   . COPD (chronic obstructive pulmonary disease)   . Osteoporosis   . Breast calcifications     right breast  . Arthritis   . Breast calcifications, right 06/27/2012    Surgically excised on 17Oct13    Past Surgical History  Procedure Laterality Date  . Tonsillectomy    . Fracture surgery      rt foot  . Eye surgery      rt cataract  . Colonoscopy    . Breast biopsy  07/21/2012    Procedure: BREAST BIOPSY WITH NEEDLE LOCALIZATION;  Surgeon: Currie Paris, MD;  Location: [facility];  Service: General;  Laterality: Right;   Prior to Admission medications    Medication Sig Start Date End Date Taking? Authorizing Provider  albuterol (PROVENTIL HFA;VENTOLIN HFA) 108 (90 BASE) MCG/ACT inhaler Inhale 1 puff into the lungs every 6 (six) hours as needed for wheezing or shortness of breath.   Yes Historical Provider, MD  Biotin 1000 MCG tablet Take 1,000 mcg by mouth 3 (three) times daily.   Yes Historical Provider, MD  budesonide-formoterol (SYMBICORT) 160-4.5 MCG/ACT inhaler Inhale 2 puffs into the lungs 2 (two) times daily.   Yes Historical Provider, MD  Calcium Carbonate-Vitamin D (CALCIUM + D) 600-200 MG-UNIT TABS Take 1 tablet by mouth 2 (two) times daily.    Yes Historical Provider, MD  Multiple Vitamins-Minerals (OCUVITE PRESERVISION) TABS Take 1 tablet by mouth daily.    Yes Historical Provider, MD  omeprazole (PRILOSEC) 20 MG capsule Take 20 mg by mouth daily.     Yes Historical Provider, MD  Triamcinolone Acetonide (NASACORT ALLERGY 24HR) 55 MCG/ACT AERO Place 2 sprays into the nose daily.    Yes Historical Provider, MD   Allergies  Allergen Reactions  . Prednisone     Pt stated that this medication made her feel terrible.     FAMILY HISTORY:  Family History  Problem Relation Age of Onset  . Heart disease Father   . Cancer Father     colon  . Heart disease Brother   .  Heart disease Sister   . Cancer Sister     colon  . Breast cancer Mother   . Cancer Mother     breast   SOCIAL HISTORY:  reports that she quit smoking about 23 years ago. Her smoking use included Cigarettes. She smoked 0.00 packs per day for 25 years. She has never used smokeless tobacco. She reports that she drinks alcohol. She reports that she does not use illicit drugs.  REVIEW OF SYSTEMS:  A review of 14 systems was negative except as stated in the HPI.   SUBJECTIVE:   VITAL SIGNS: Temp:  [98.7 F (37.1 C)] 98.7 F (37.1 C) (09/06 1612) Pulse Rate:  [90-117] 90 (09/06 2125) Resp:  [20-29] 20 (09/06 2125) BP: (135-157)/(64-81) 157/74 mmHg (09/06  2125) SpO2:  [92 %-96 %] 93 % (09/06 2125) HEMODYNAMICS:   VENTILATOR SETTINGS:   INTAKE / OUTPUT: No intake or output data in the 24 hours ending 06/10/14 2240  PHYSICAL EXAMINATION: General:  Well appearing elderly woman, sitting up on stretcher in no acute distress.  Neuro:  A & O x4.  CN II-XII grossly intact.   HEENT:  PERRL, EOMI, OP clear, good dentition  Cardiovascular:  NRRR, III/VI systolic murmur, loudest at RUSB Lungs:  Intermittent inspiratory wheeze.  Decreased breath sounds in RUL. No crackles Abdomen:  Soft, NTND, no HSM, + bowel sounds throughout Musculoskeletal:  No clubbing, edema or cyanosis Skin:  No rashes, petechiae or other lesions.   LABS:  CBC  Recent Labs Lab 06/10/14 1644  WBC 9.4  HGB 14.3  HCT 42.3  PLT 265   Coag's No results found for this basename: APTT, INR,  in the last 168 hours BMET  Recent Labs Lab 06/10/14 1644  NA 142  K 3.9  CL 101  CO2 25  BUN 13  CREATININE 0.49*  GLUCOSE 136*   Electrolytes  Recent Labs Lab 06/10/14 1644  CALCIUM 10.2   Sepsis Markers No results found for this basename: LATICACIDVEN, PROCALCITON, O2SATVEN,  in the last 168 hours ABG No results found for this basename: PHART, PCO2ART, PO2ART,  in the last 168 hours Liver Enzymes  Recent Labs Lab 06/10/14 1644  AST 17  ALT 13  ALKPHOS 93  BILITOT 0.3  ALBUMIN 3.9   Cardiac Enzymes  Recent Labs Lab 06/10/14 1644  TROPONINI <0.30   Glucose No results found for this basename: GLUCAP,  in the last 168 hours  Imaging CXR with new small RUL infiltrate   ASSESSMENT / PLAN:  PULMONARY A:COPD exacerbation vs. bronchitis P:   Started on steroids and abx in ED Will continue methylpred  daily.  Continue symbicort Albuterol q4 with Q2 h PRN Incentive spirometry Sputum culture for bacteria and AFB Continue ceftaz and levaquin for now, with bronchiectasis at risk for pseudomonas.  Can likely be transitioned to augmentin or  levaquin alone pending sputum culture results.  - Considered possibility of PE.  Wells score is 0, onset of SOB was not particularly acute.  I think the likelihood of PE is quite low.  TODAY'S SUMMARY: 80 y/o with bronchiectasis and COPD admitted for exacerbation.   I have personally obtained a history, examined the patient, evaluated laboratory and imaging results, formulated the assessment and plan and placed orders.   Sandi Carne MD, PhD Pulmonary and Critical Care Medicine [facility] Pager: [phone]  06/10/2014, 10:40 PM

## 2014-06-10 NOTE — ED Notes (Signed)
Pt ambulated with one assistance to the bathroom. Pt ambulated on Rm Air and sats maintained at 90-92%. Pt did have DOE with activity.

## 2014-06-10 NOTE — ED Notes (Signed)
Bed: ZO10 Expected date: 06/10/14 Expected time: 3:55 PM Means of arrival: Ambulance Comments: Shortness of Breath

## 2014-06-10 NOTE — ED Notes (Signed)
Per EMS- Patient c/o SOB x 4 days, worse today. Patient was recently prescribed Levaquin and Pro-air for respiratory infection. Patient states she used her Pro-air with no relief today. Patient has a productive cough with clear and occasional green sputum. Patient was given an Albuterol neb treatment prior to arrival to the ED for expiratory wheezing with relief. Sats 93% on O2 2L./min via [place].

## 2014-06-10 NOTE — ED Provider Notes (Signed)
CSN: [id number]     Arrival date & time 06/10/14  1600 History   First MD Initiated Contact with Patient 06/10/14 1609     Chief Complaint  Patient presents with  . Shortness of Breath     (Consider location/radiation/quality/duration/timing/severity/associated sxs/prior Treatment) HPI 80 y.o. Female with history of copd and bronchiectasis presents today with worsened sob began this am.  She had a recent change in sputum color and was levaquin for 7 days which she completed 3 days ago and felt well.  She is not on oxygen at home. Today her dyspnea felt much worse and she used her albuterol more than usual but continued to feel more short of breath.  She does not note any new change in sputum, fever, chills, chest pain, leg swelling or pain.  She denies any history of cad, pe, or dvt.  She was transported via ems and had another neb en route.  She feels improved here on on oxygen via [place].  Past Medical History  Diagnosis Date  . GERD (gastroesophageal reflux disease)   . Bronchiectasis   . COPD (chronic obstructive pulmonary disease)   . Osteoporosis   . Breast calcifications     right breast  . Arthritis   . Breast calcifications, right 06/27/2012    Surgically excised on 17Oct13    Past Surgical History  Procedure Laterality Date  . Tonsillectomy    . Fracture surgery      rt foot  . Eye surgery      rt cataract  . Colonoscopy    . Breast biopsy  07/21/2012    Procedure: BREAST BIOPSY WITH NEEDLE LOCALIZATION;  Surgeon: Currie Paris, MD;  Location: [facility];  Service: General;  Laterality: Right;   Family History  Problem Relation Age of Onset  . Heart disease Father   . Cancer Father     colon  . Heart disease Brother   . Heart disease Sister   . Cancer Sister     colon  . Breast cancer Mother   . Cancer Mother     breast   History  Substance Use Topics  . Smoking status: Former Smoker -- 25 years    Types: Cigarettes    Quit date: 10/05/1990   . Smokeless tobacco: Never Used  . Alcohol Use: Yes   OB History   Grav Para Term Preterm Abortions TAB SAB Ect Mult Living                 Review of Systems  Respiratory: Positive for shortness of breath and wheezing.   Cardiovascular: Negative for chest pain, palpitations and leg swelling.  Gastrointestinal: Negative for nausea, vomiting and abdominal distention.  Genitourinary: Negative.   Musculoskeletal: Negative.   Skin: Negative.   All other systems reviewed and are negative.     Allergies  Prednisone  Home Medications   Prior to Admission medications   Medication Sig Start Date End Date Taking? Authorizing Provider  Biotin 1000 MCG tablet Take 1,000 mcg by mouth 3 (three) times daily.   Yes Historical Provider, MD  Calcium Carbonate-Vitamin D (CALCIUM + D) 600-200 MG-UNIT TABS Take 1 tablet by mouth 2 (two) times daily.    Yes Historical Provider, MD  Multiple Vitamins-Minerals (OCUVITE PRESERVISION) TABS Take by mouth daily.     Yes Historical Provider, MD  omeprazole (PRILOSEC) 20 MG capsule Take 20 mg by mouth daily.     Yes Historical Provider, MD  Triamcinolone Acetonide (NASACORT  ALLERGY 24HR) 55 MCG/ACT AERO Place 2 sprays into the nose daily as needed.   Yes Historical Provider, MD   BP 135/81  Pulse 117  Temp(Src) 98.7 F (37.1 C) (Oral)  Resp 29  SpO2 96% Physical Exam  Nursing note and vitals reviewed. Constitutional: She is oriented to person, place, and time. She appears well-developed and well-nourished.  HENT:  Head: Normocephalic and atraumatic.  Right Ear: External ear normal.  Left Ear: External ear normal.  Nose: Nose normal.  Mouth/Throat: Oropharynx is clear and moist.  Eyes: Conjunctivae and EOM are normal. Pupils are equal, round, and reactive to light.  Neck: Normal range of motion. Neck supple.  Cardiovascular: Normal rate, regular rhythm, normal heart sounds and intact distal pulses.   Pulmonary/Chest: Effort normal. She has  wheezes.  Diffuse basilar rhonchi  Abdominal: Soft. Bowel sounds are normal.  Musculoskeletal: Normal range of motion.  Neurological: She is alert and oriented to person, place, and time. She has normal reflexes.  Skin: Skin is warm and dry.  Psychiatric: She has a normal mood and affect. Her behavior is normal. Judgment and thought content normal.    ED Course  Procedures (including critical care time) Labs Review Labs Reviewed  CBC WITH DIFFERENTIAL - Abnormal; Notable for the following:    Neutrophils Relative % 80 (*)    All other components within normal limits  COMPREHENSIVE METABOLIC PANEL - Abnormal; Notable for the following:    Glucose, Bld 136 (*)    Creatinine, Ser 0.49 (*)    GFR calc non Af Amer 88 (*)    Anion gap 16 (*)    All other components within normal limits  TROPONIN I    Imaging Review No results found.   EKG Interpretation   Date/Time:  Sunday June 10 2014 16:36:46 EDT Ventricular Rate:  116 PR Interval:  160 QRS Duration: 112 QT Interval:  340 QTC Calculation: 472 R Axis:   -78 Text Interpretation:  Sinus tachycardia Biatrial enlargement Incomplete  RBBB and LAFB Low voltage, precordial leads hr rate has increased from  first prior ekg Confirmed by Mercedies Ganesh MD, Duwayne Heck ([id number]) on 06/10/2014 5:55:20  PM      MDM   Final diagnoses:  COPD exacerbation   80 y.o. Female with copd with recent exacerbation presents today after failing op therapy with worsening dyspnea.  Patient with ongoing wheezing and dyspnea after multiple nebs.  Patient treated with solumedrol.  Patient followed by Dr. Delton Coombes, pulmonary.  Discussed with Dr. De Burrs and advised levaquin vancomycin and fortaz and he will send fellow to admit but does not wish temp admit orders.     Hilario Quarry, MD 06/10/14 2034

## 2014-06-11 DIAGNOSIS — J471 Bronchiectasis with (acute) exacerbation: Secondary | ICD-10-CM

## 2014-06-11 DIAGNOSIS — J449 Chronic obstructive pulmonary disease, unspecified: Secondary | ICD-10-CM

## 2014-06-11 LAB — EXPECTORATED SPUTUM ASSESSMENT W REFEX TO RESP CULTURE

## 2014-06-11 LAB — EXPECTORATED SPUTUM ASSESSMENT W GRAM STAIN, RFLX TO RESP C: Special Requests: NORMAL

## 2014-06-11 MED ORDER — METHYLPREDNISOLONE SODIUM SUCC 40 MG IJ SOLR
40.0000 mg | INTRAMUSCULAR | Status: DC
  Filled 2014-06-11: qty 1

## 2014-06-11 NOTE — Care Management Note (Addendum)
    Page 1 of 1   06/13/2014     2:19:54 PM CARE MANAGEMENT NOTE 06/13/2014  Patient:  DESHANNA, KAMA   Account Number:  [id number]  Date Initiated:  06/11/2014  Documentation initiated by:  Lanier Clam  Subjective/Objective Assessment:   80 Y/O F ADMITTED W/COPD.     Action/Plan:   FROM HOME.   Anticipated DC Date:  06/13/2014   Anticipated DC Plan:  HOME/SELF CARE      DC Planning Services  CM consult      Choice offered to / List presented to:             Status of service:  Completed, signed off Medicare Important Message given?  YES (If response is "NO", the following Medicare IM given date fields will be blank) Date Medicare IM given:  06/13/2014 Medicare IM given by:  [facility] Date Additional Medicare IM given:   Additional Medicare IM given by:    Discharge Disposition:  HOME/SELF CARE  Per UR Regulation:  Reviewed for med. necessity/level of care/duration of stay  If discussed at Long Length of Stay Meetings, dates discussed:    Comments:  06/13/14 Tajai Ihde RN,BSN NCM [phone] D/C HOME NO NEEDS OR ORDERS.  06/11/14 Alethea Terhaar RN,BSN NCM [phone] IV SOLUMEDROL,IV ABX.SPUTUM CX PEND.

## 2014-06-11 NOTE — Progress Notes (Signed)
Subjective: Feels better overnight, no increased wob  Objective: Vital signs in last 24 hours: Blood pressure 175/75, pulse 118, temperature 97.9 F (36.6 C), temperature source Oral, resp. rate 28, height  (1.6 m), weight 45.859 kg (101 lb 1.6 oz), SpO2 94.00%.  Intake/Output from previous day: 09/06 0701 - 09/07 0700 In: 1075 [P.O.:120; I.V.:905; IV Piggyback:50] Out: 100 [Urine:100]   Physical Exam:   thin female in nad Nose without purulence or d/c noted. OP clear Neck without LN or TMG Chest with scattered crackles and rhonchi, no wheezing, good airflow Cor with regular but tachy abd soft, NT, BS+ LE without edema or cyanosis Alert and oriented, moves all 4.    Lab Results:  Recent Labs  06/10/14 1644  WBC 9.4  HGB 14.3  HCT 42.3  PLT 265   BMET  Recent Labs  06/10/14 1644  NA 142  K 3.9  CL 101  CO2 25  GLUCOSE 136*  BUN 13  CREATININE 0.49*  CALCIUM 10.2    Studies/Results: Dg Chest 2 View  06/10/2014   CLINICAL DATA:  Shortness of breath, COPD exacerbation  EXAM: CHEST  2 VIEW  COMPARISON:  CT chest dated 03/13/2011  FINDINGS: Multifocal patchy opacities in the right lung. Indwelling middle g of prior CT, this appearance is similar, and likely related to atypical mycobacterial infection. However, a focal right upper lobe opacity could be new since 2012.  Additional subpleural opacities at the lateral left lung base are also likely infectious/ inflammatory.  No pleural effusion or pneumothorax.  The heart is top-normal in size. Fullness of the right pulmonary hilum, indeterminate.  Degenerative changes of the visualized thoracolumbar spine.  IMPRESSION: Focal right upper lobe opacity, new since 2012, although likely related to atypical mycobacterial infection. Neoplasm cannot be excluded. Consider CT chest with contrast for further evaluation as clinically warranted.  Fullness of the right pulmonary hilum, indeterminate  Additional multifocal patchy  opacities are likely chronic, related to atypical mycobacterial infection such as MAI.   Electronically Signed   By: Charline Bills M.D.   On: 06/10/2014 17:10    Assessment/Plan:  1) bronchiectasis with acute exacerbation.   She failed levaquin alone as outpt, and currently on IV fortaz and po levaquin.  Cultures are pending.  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  -f/u sputum cultures. -continue current abx for now  2) H/o COPD She currently has no acute bronchospasm on exam, and no increased wob -taper steroids, change to PO tomorrow? -continue symbicort with prn albuterol nebs  3) ?new nodule RUL from 2012? Probably due to MAC.  Will leave to Dr. Delton Coombes whether to pursue CT chest as outpt.     Barbaraann Share, M.D. 06/11/2014, 2:03 PM

## 2014-06-12 ENCOUNTER — Inpatient Hospital Stay (HOSPITAL_COMMUNITY): Payer: Medicare Other

## 2014-06-12 DIAGNOSIS — J441 Chronic obstructive pulmonary disease with (acute) exacerbation: Principal | ICD-10-CM

## 2014-06-12 MED ORDER — PREDNISONE 20 MG PO TABS
40.0000 mg | ORAL_TABLET | Freq: Every day | ORAL | Status: DC
  Administered 2014-06-12 – 2014-06-13 (×2): 40 mg via ORAL
  Filled 2014-06-12 (×3): qty 2

## 2014-06-12 NOTE — Progress Notes (Signed)
PCCM PROGRESS NOTE   Subjective: Feels bette. Feels inpatient stay has helped clear her up.  Feels ready to go home tomorrow  Objective: Vital signs in last 24 hours: Blood pressure 130/50, pulse 94, temperature 97.8 F (36.6 C), temperature source Oral, resp. rate 22, height  (1.6 m), weight 101 lb 1.6 oz (45.859 kg), SpO2 93.00%.  Intake/Output from previous day: 09/07 0701 - 09/08 0700 In: 2166 [P.O.:960; I.V.:1056; IV Piggyback:150] Out: 1950 [Urine:1950]   Physical Exam:   thin female in nad Nose without purulence or d/c noted. OP clear Neck without LN or TMG Chest with scattered crackles and rhonchi, no wheezing, good airflow Cor with regular but tachy abd soft, NT, BS+ LE without edema or cyanosis Alert and oriented, moves all 4.     PULMONARY No results found for this basename: PHART, PCO2, PCO2ART, PO2, PO2ART, HCO3, TCO2, O2SAT,  in the last 168 hours  CBC  Recent Labs Lab 06/10/14 1644  HGB 14.3  HCT 42.3  WBC 9.4  PLT 265    COAGULATION No results found for this basename: INR,  in the last 168 hours  CARDIAC   Recent Labs Lab 06/10/14 1644  TROPONINI <0.30   No results found for this basename: PROBNP,  in the last 168 hours   CHEMISTRY  Recent Labs Lab 06/10/14 1644  NA 142  K 3.9  CL 101  CO2 25  GLUCOSE 136*  BUN 13  CREATININE 0.49*  CALCIUM 10.2   Estimated Creatinine Clearance: 39.3 ml/min (by C-G formula based on Cr of 0.49).   LIVER  Recent Labs Lab 06/10/14 1644  AST 17  ALT 13  ALKPHOS 93  BILITOT 0.3  PROT 7.9  ALBUMIN 3.9     INFECTIOUS No results found for this basename: LATICACIDVEN, PROCALCITON,  in the last 168 hours   ENDOCRINE CBG (last 3)  No results found for this basename: GLUCAP,  in the last 72 hours       IMAGING x48h No results found.    MICRO 06/11/14 - sputum not adequate  Assessment/Plan:  1) bronchiectasis with acute exacerbation.   She failed levaquin alone  as outpt, and currently on IV fortaz and po levaquin.  Cultures are pending but it seems sputum specimen not adequate. .  She clearly has cxr abnormalities that are c/w MAC colonization,  PLAN -f/u sputum cultures. -continue current abx for now  2) H/o COPD She currently has no acute bronchospasm on exam, and no increased wob -change IV solumedrol to po prednisone 06/12/2014 -continue symbicort with prn albuterol nebs  3) ?new nodule RUL from 2012? Probably due to MAC but to be on safe side get CT chest wo contrast   4) Others  - dc telemetry and continous pulse ox  - start IV saline lock  - anticipate DC 06/13/14 Tuesday - continue heparin and protonix for best practice   5) Code Status   - full code    Dr. Kalman Shan, M.D., [facility].P Pulmonary and Critical Care Medicine Staff Physician [facility] [place] [facility] Pager: [phone], If no answer or between  15:00h - 7:00h: call [phone]  06/12/2014 8:51 AM

## 2014-06-12 NOTE — Progress Notes (Signed)
Care of pt assumed at this time.  Pt assessed, reviewed previously charted assessment and agree.  Will continue to monitor.  Roy Snuffer Ann, RN 06/12/2014 

## 2014-06-13 MED ORDER — PREDNISONE 10 MG PO TABS: ORAL_TABLET | ORAL | Status: DC

## 2014-06-13 MED ORDER — CEFDINIR 300 MG PO CAPS: 300.0000 mg | ORAL_CAPSULE | Freq: Two times a day (BID) | ORAL | Status: DC

## 2014-06-13 NOTE — Discharge Summary (Signed)
Physician Discharge Summary       Patient ID: Karen Costa MRN: [id number] DOB/AGE: 1929-10-22 80 y.o.  Admit date: 06/10/2014 Discharge date: 06/13/2014  Discharge Diagnoses:   bronchiectasis with acute exacerbation.  COPD  new nodule RUL from 2012, felt to be changes of chronic MAC  Detailed Hospital Course:  80 y/o woman with past medical history of bronchiectasis and COPD who was treated for a week prior to admission with levaquin for COPD exacerbation developed worsening cough and shortness of breath over the last 48 hours. Initially her symptoms had improved with levaquin, sputum had cleared, however over the last 2 days prior to admit the cough had worsened again, sputum has gone from clear to tan and she has felt more short of breath. She had not had any fever, chills, night sweats, nausea or vomiting. She denied any chest pain. She did not feel like she was wheezing, she just felt short of breath. She was admitted for IV antibiotics and supportive therapy. CXR showed findings c/w chronic MAC colonization but we did not think this was contributing to the decompensation. She made slow but steady improvement over the course of her stay. CT chest was obtained on 9/8 to further evaluate new RUL nodule. This was felt to be more consistent with a chronic MAC infection and it was recommended that CT chest be followed up in 6 months. Her prednisone was transitioned to oral on 9/8 and antibiotics were narrowed at discharge. She has met maximum benefit from in-patient stay and was deemed ready for d/c as of 9/9 with the following plan of care.    Discharge Plan by active problems   bronchiectasis with acute exacerbation.  She failed levaquin alone as outpt, and currently on IV fortaz and po levaquin. Cultures are pending but it seems sputum specimen not adequate.  Plan Home to complete 10d total rx Omnicef  F/u AF[employer]. Defer long term rx of possible MAC to Dr Lamonte Sakai   H/o COPD  Plan pred  taper  continue symbicort with prn albuterol nebs   new nodule RUL from 2012?  CT chest 9/8 c/w prob chronic mycobacterium  Plan Repeat CT chest 6 months    Significant Hospital tests/ studies  Consults   CT chest 9/8: Right apical densities are stable since 2012 compatible with scarring. Peripheral interstitial thickening and nodularity in the right upper lobe and right lower lobe are stable since 2012. Increasing peripheral clustered nodules in the left lower lobe, likely infectious/inflammatory. 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 New cavitary area in the right lower lobe measuring 17 mm. This is not demonstrate suspicious nodularity and is also most likely related to chronic mycobacterium infection. New nodular area lung the right hemidiaphragm measuring 17 mm. Favor postinflammatory as well. This could be followed with repeat CT in 6  months.   Discharge Exam: BP 138/66  Pulse 66  Temp(Src) 97.8 F (36.6 C) (Oral)  Resp 18  Ht $R'5\' 3"'TO$  (1.6 m)  Wt 45.859 kg (101 lb 1.6 oz)  BMI 17.91 kg/m2  SpO2 92%  thin female in nad  Nose without purulence or d/c noted.  Neck without LN or TMG  Chest with scattered crackles and rhonchi, no wheezing, good airflow  Cor with regular but tachy  abd soft, NT, BS+  LE without edema or cyanosis  Alert and oriented, moves all 4.    Labs at discharge Lab Results  Component Value Date   CREATININE 0.49* 06/10/2014  BUN 13 06/10/2014   NA 142 06/10/2014   K 3.9 06/10/2014   CL 101 06/10/2014   CO2 25 06/10/2014   Lab Results  Component Value Date   WBC 9.4 06/10/2014   HGB 14.3 06/10/2014   HCT 42.3 06/10/2014   MCV 92.0 06/10/2014   PLT 265 06/10/2014   Lab Results  Component Value Date   ALT 13 06/10/2014   AST 17 06/10/2014   ALKPHOS 93 06/10/2014   BILITOT 0.3 06/10/2014   No results found for this basename: INR,  PROTIME    Current radiology studies Ct Chest Wo Contrast  06/12/2014   CLINICAL DATA:  Follow-up  infiltrates and lung nodule. Shortness of breath, weakness. History of COPD and bronchiectasis.  EXAM: CT CHEST WITHOUT CONTRAST  TECHNIQUE: Multidetector CT imaging of the chest was performed following the standard protocol without IV contrast.  COMPARISON:  Chest x-ray 06/10/2014.  Chest CT 03/13/2011.  FINDINGS: Right apical and peripheral right upper lobe nodular densities are noted, stable since prior CT compatible with scarring. Interstitial prominence and clustered micro and macro nodularity noted in the right upper lobe, also stable since 2012 compatible with sequela of old infection/inflammation. Similar peripheral nodular densities in the right lower lobe, stable.  New cavitary area in the right lower lobe measuring 18 mm. Overlying pleural thickening. New nodular area along the right diaphragm in the right lower lobe on image 50 measuring 17 mm.  Peripheral nodular densities in the left lower lobe have increased since 2012, most likely infectious/ inflammatory. 8 mm peripheral nodule on image 39 in the left lower lobe is stable.  No pleural effusions.  Areas of bronchiectasis noted in the right upper lobe and right lower lobe, stable. No mediastinal, hilar, or axillary adenopathy. Chest wall soft tissues are unremarkable. Heart is normal size. Scattered aortic and coronary artery calcifications. Imaging into the upper abdomen shows no acute findings. No acute bony abnormality.  IMPRESSION: Right apical densities are stable since 2012 compatible with scarring. Peripheral interstitial thickening and nodularity in the right upper lobe and right lower lobe are stable since 2012. Increasing peripheral clustered nodules in the left lower lobe, likely infectious/inflammatory. 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  New cavitary area in the right lower lobe measuring 17 mm. This is not demonstrate suspicious nodularity and is also most likely related to chronic mycobacterium  infection.  New nodular area lung the right hemidiaphragm measuring 17 mm. Favor postinflammatory as well. This could be followed with repeat CT in 6 months.   Electronically Signed   By: Rolm Baptise M.D.   On: 06/12/2014 10:49    Disposition:  01-Home or Self Care      Discharge Instructions   Diet - low sodium heart healthy    Complete by:  As directed      Increase activity slowly    Complete by:  As directed             Medication List         albuterol 108 (90 BASE) MCG/ACT inhaler  Commonly known as:  PROVENTIL HFA;VENTOLIN HFA  Inhale 1 puff into the lungs every 6 (six) hours as needed for wheezing or shortness of breath.     Biotin 1000 MCG tablet  Take 1,000 mcg by mouth 3 (three) times daily.     budesonide-formoterol 160-4.5 MCG/ACT inhaler  Commonly known as:  SYMBICORT  Inhale 2 puffs into the lungs 2 (two) times daily.  Calcium Carbonate-Vitamin D 600-200 MG-UNIT Tabs  Take 1 tablet by mouth 2 (two) times daily.     cefdinir 300 MG capsule  Commonly known as:  OMNICEF  Take 1 capsule (300 mg total) by mouth 2 (two) times daily.     NASACORT ALLERGY 24HR 55 MCG/ACT Aero nasal inhaler  Generic drug:  triamcinolone  Place 2 sprays into the nose daily.     OCUVITE PRESERVISION Tabs  Take 1 tablet by mouth daily.     omeprazole 20 MG capsule  Commonly known as:  PRILOSEC  Take 20 mg by mouth daily.     predniSONE 10 MG tablet  Commonly known as:  DELTASONE  Take 3 tabs  daily with food x 3 days, then 2 tabs daily x 3 days, then 1 tabs daily x 3 days, then stop.       Follow-up Information   Follow up with Collene Gobble., MD On 06/20/2014. (230 pm )    Specialty:  Pulmonary Disease   Contact information:   [address] [address] [phone]       Discharged Condition: good  Physician Statement:   The Patient was personally examined, the discharge assessment and plan has been personally reviewed and I agree with ACNP  Babcock's assessment and plan. > 30 minutes of time have been dedicated to discharge assessment, planning and discharge instructions.   Signed: BABCOCK,PETE 06/13/2014, 12:35 PM

## 2014-06-20 ENCOUNTER — Ambulatory Visit (INDEPENDENT_AMBULATORY_CARE_PROVIDER_SITE_OTHER): Payer: Medicare Other | Admitting: Emergency Medicine

## 2014-06-20 ENCOUNTER — Encounter: Payer: Self-pay | Admitting: Emergency Medicine

## 2014-06-20 VITALS — BP 110/60 | HR 112 | Ht 63.0 in | Wt 99.6 lb

## 2014-06-20 DIAGNOSIS — J479 Bronchiectasis, uncomplicated: Secondary | ICD-10-CM

## 2014-06-20 MED ORDER — AZITHROMYCIN 250 MG PO TABS: ORAL_TABLET | ORAL | Status: DC

## 2014-06-20 MED ORDER — DOXYCYCLINE HYCLATE 100 MG PO TABS: ORAL_TABLET | ORAL | Status: DC

## 2014-06-20 MED ORDER — LEVOFLOXACIN 500 MG PO TABS: ORAL_TABLET | ORAL | Status: DC

## 2014-06-20 NOTE — Addendum Note (Signed)
Addended by: Gweneth Dimitri D on: 06/20/2014 03:31 PM   Modules accepted: Orders

## 2014-06-20 NOTE — Patient Instructions (Addendum)
We will start a regimen of alternating antibiotics at the beginning of each month. We will start in October 2015. You will alternate doxycycline, azithromycin and Levaquin as directed.  Finish your prednisone and Cefdinir as planned.  Continue your inhaled medications Follow with Dr Delton Coombes in 3 months or sooner if you have any problems. Alternating antibiotic strategy at the START of each month:  October 2015 - take doxycycline 100 mg twice daily x 7 days  November 2015 - Azithromycin take 250 mg once daily x 5 days  December 2015 - Take Levaquin 500 mg once daily x 7 days  January 2016 - Repeat

## 2014-06-20 NOTE — Assessment & Plan Note (Signed)
Suspect she may be colonized with MAC although her AFB cultures from her recent hospitalization were negative. For now like to treat her with alternating antibiotics at the beginning of each month to see if this cuts down on her purulent mucous and dyspnea. She will finish pred and cefdinir. Continue symbicort

## 2014-06-20 NOTE — Progress Notes (Signed)
Subjective:   Patient ID: Karen Costa, female    DOB: Mar 10, 1930, 80 y.o.   MRN: [id number]  HPI Karen Costa is a 80 year old woman who follows for COPD and bronchiectasis.  She also has a history of a right sided pulmonary nodule. her last CT Scan, 11/08, showed stable RLL scar, also seen on f/u CT scan 6/12. . No longer uses spiriva or advair. Last seen 6/13. She remains very active, no breathing limitations. She uses albuterol prn, about once a week. Cough under good control, allergies under good control. She is interested in Oralair > a sublingual allergy med, against 5 of th most common grasses.   ROV 05/10/14 -- hx COPD and bronchiectasis, allergies, nodular disease. She has been seen twice since our last visit for AE, last treated with levaquin x 5 days. She was primarily having dyspnea, but now also with some yellow thick mucous.   ROV 06/20/14 --  COPD and bronchiectasis, allergies, nodular disease. She has been having much more trouble over the last several months - more dyspnea, more purulent mucus. She was admitted early September for an exacerbation. She has micronodular disease that has been ascribed to possible chronic mycobacterial colonization (no cx data).  She is much improved since hospital. She has not needed to use her albuterol. She has mucous production but it is clear. She is finishing prednisone taper now, cefdinir.         Objective:   Physical Exam Filed Vitals:   06/20/14 1432  BP: 110/60  Pulse: 112   Gen: Pleasant, thin, in no distress,  normal affect  ENT: No lesions,  mouth clear,  oropharynx clear, no postnasal drip  Neck: No JVD, no TMG, no carotid bruits  Lungs: coarse B, rhonchi, no wheeze. Few insp squeeks  Cardiovascular: RRR, heart sounds normal, no murmur or gallops, no peripheral edema  Musculoskeletal: No deformities, no cyanosis or clubbing  Neuro: alert, non focal  Skin: Warm, no lesions or rashes     CT chest 03/11/11  Comparison:  08/25/2007.  Findings: Lung windows demonstrate similar narrowing and  irregularity of the lobar and segmental bronchi to the right middle  lobe on image 28 of series 3.  Pleural parenchymal scarring at the right apex is unchanged.  Diffuse interstitial and reticular nodular opacities are similar.  Some areas demonstrate minimal progression. For example, a  subpleural interstitial opacity in the right lower lobe on image 39  series 3 is new. A subpleural 4 mm nodular density in the left  upper lobe on image 29 is new.  There is no dominant right upper lobe nodule or mass identified.  Bronchiectasis in the right middle lobe with volume loss is  similar. Probable scarring in the subpleural left lower lobe at 9  mm on image 39.  Soft tissue windows demonstrate cardiomegaly accentuated by pectus  deformity. No pericardial or pleural effusion. Multivessel  coronary artery atherosclerosis. No mediastinal or definite hilar  adenopathy, given limitations of unenhanced CT.  Limited abdominal imaging demonstrates right hepatic lobe cyst. No  significant findings. No acute osseous abnormality. Exaggeration  of thoracic kyphosis.  IMPRESSION:  1. Similar to minimal increase in relatively diffuse interstitial  and reticular nodular opacities. Concurrent right middle lobe  bronchiectasis and volume loss. 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  2. No evidence of dominant right upper lobe pulmonary nodule or  mass.   Assessment & Plan:  BRONCHIECTASIS WITHOUT ACUTE EXACERBATION Suspect she may  be colonized with MAC although her AFB cultures from her recent hospitalization were negative. For now like to treat her with alternating antibiotics at the beginning of each month to see if this cuts down on her purulent mucous and dyspnea. She will finish pred and cefdinir. Continue symbicort

## 2014-07-10 ENCOUNTER — Other Ambulatory Visit: Payer: Self-pay | Admitting: Emergency Medicine

## 2014-07-17 ENCOUNTER — Telehealth: Payer: Self-pay | Admitting: Emergency Medicine

## 2014-07-17 NOTE — Telephone Encounter (Signed)
Called spoke with pt. She reports we have instructions for her albuterol inhaler 1 puff every 6 hrs prn. Pt reports she was told she could do 2 puffs. I advised her will fix this on her medication list. Nothing further needed

## 2014-07-23 LAB — AFB CULTURE, BLOOD

## 2014-07-30 NOTE — Discharge Summary (Signed)
Staff MD note  - supervised discharge. See NP notes for details   Dr. Kalman ShanMurali Zarayah Lanting, M.D., [facility].C.P Pulmonary and Critical Care Medicine Staff Physician [facility] [place] [facility] Pager: [phone], If no answer or between  15:00h - 7:00h: call [phone]  07/30/2014 4:52 PM

## 2014-08-01 ENCOUNTER — Other Ambulatory Visit: Payer: Self-pay | Admitting: Emergency Medicine

## 2014-08-17 ENCOUNTER — Telehealth: Payer: Self-pay | Admitting: Emergency Medicine

## 2014-08-17 NOTE — Telephone Encounter (Signed)
RB pt. Called and spoke to pt. Pt stated she is having an increase in SOB and prod cough with yellow mucus. RB has pt on rotating abx. Pt just finished zithromax 250mg  x 5 days. Pt stated she is scheduled to take Levaquin on the first week of December. Pt denies CP/tightness, f/c/s or swelling. Pt last seen by RB on 06/20/14. [facility]   [facility] please advise.   Allergies  Allergen Reactions  . Prednisone     Pt stated that this medication made her feel terrible.

## 2014-08-17 NOTE — Telephone Encounter (Signed)
Spoke with the pt and notified of recs per [facility]  She verbalized understanding  Nothing further needed 

## 2014-08-17 NOTE — Telephone Encounter (Signed)
Pt is calling back b/c she hasn't been reached yet.  Karen Costa

## 2014-08-17 NOTE — Telephone Encounter (Signed)
Let her know that zpack stays in system up to 2 weeks after she stops taking.  Would give it a lilttle longer to see if mucus starts clearing.  Call us back Mon/tues if not getting better.

## 2014-08-22 ENCOUNTER — Encounter: Payer: Self-pay | Admitting: Pulmonary Disease

## 2014-08-22 ENCOUNTER — Ambulatory Visit (INDEPENDENT_AMBULATORY_CARE_PROVIDER_SITE_OTHER): Payer: Medicare Other | Admitting: Pulmonary Disease

## 2014-08-22 DIAGNOSIS — J471 Bronchiectasis with (acute) exacerbation: Secondary | ICD-10-CM

## 2014-08-22 MED ORDER — CIPROFLOXACIN HCL 500 MG PO TABS: 500.0000 mg | ORAL_TABLET | Freq: Two times a day (BID) | ORAL | Status: DC

## 2014-08-22 MED ORDER — METHYLPREDNISOLONE 16 MG PO TABS: ORAL_TABLET | ORAL | Status: DC

## 2014-08-22 NOTE — Patient Instructions (Signed)
Will treat with medrol (an easier form of prednisone) to help your wheezing and shortness of breath. Continue on symbicort  Take cipro 500mg  one in am and pm for 7 days.  Do not start until you can give us a sputum specimen. Will give you a sputum cup to take home.  Please bring back to lab in am after producing early morning specimen.  Can keep in fridge for a few hours until you can get here.  Let us know if you are not improving.

## 2014-08-22 NOTE — Addendum Note (Signed)
Addended by: Tommie SamsSILVA, MINDY S on: 08/22/2014 02:44 PM   Modules accepted: Orders

## 2014-08-22 NOTE — Assessment & Plan Note (Signed)
The patient has known bronchiectasis with underlying obstructive lung disease, and I suspect she is developing an acute exacerbation. I will go ahead and treat her with a course of antibiotics as well as Medrol because of her wheezing and increased shortness of breath, but would like to try and get another sputum specimen to culture for bacteria and atypical mycobacteria. I have asked her to continue on her Symbicort as directed.

## 2014-08-22 NOTE — Progress Notes (Signed)
   Subjective:    Patient ID: Karen Costa, female    DOB: 03-Mar-1930, 80 y.o.   MRN: [id number]  HPI Patient comes in today for an acute sick visit. She has a history of bronchiectasis with associated obstructive lung disease, and was in the hospital in September for a flare. Unfortunately, sputum cultures were not helpful in identifying colonizing flora. She comes in today with a worsening cough, congestion, and increase in quantity of yellow mucus. She is also a little more short of breath, and feels that she is about to get sick. She has been a reliable predictor in the past. She denies any fevers, chills, or sweats.   Review of Systems  Constitutional: Negative for fever and unexpected weight change.  HENT: Positive for congestion and postnasal drip. Negative for dental problem, ear pain, nosebleeds, rhinorrhea, sinus pressure, sneezing, sore throat and trouble swallowing.   Eyes: Negative for redness and itching.  Respiratory: Positive for cough, shortness of breath and wheezing. Negative for chest tightness.   Cardiovascular: Negative for palpitations and leg swelling.  Gastrointestinal: Negative for nausea and vomiting.  Genitourinary: Negative for dysuria.  Musculoskeletal: Negative for joint swelling.  Skin: Negative for rash.  Neurological: Negative for headaches.  Hematological: Does not bruise/bleed easily.  Psychiatric/Behavioral: Negative for dysphoric mood. The patient is not nervous/anxious.        Objective:   Physical Exam Thin female in no acute distress Nose without purulence or discharge noted Neck without lymphadenopathy or thyromegaly Chest with diffuse rhonchi and a few wheezes, decreased air movement overall Cardiac exam with mild tachycardia but regular rhythm Lower extremities without significant edema, no cyanosis Alert and oriented, moves all 4 extremities.       Assessment & Plan:

## 2014-08-23 ENCOUNTER — Other Ambulatory Visit: Payer: Medicare Other

## 2014-08-23 ENCOUNTER — Other Ambulatory Visit: Payer: Self-pay | Admitting: Pulmonary Disease

## 2014-08-23 DIAGNOSIS — J471 Bronchiectasis with (acute) exacerbation: Secondary | ICD-10-CM

## 2014-08-26 LAB — RESPIRATORY CULTURE OR RESPIRATORY AND SPUTUM CULTURE
Culture: NORMAL
ORGANISM ID, BACTERIA: NORMAL

## 2014-09-04 ENCOUNTER — Telehealth: Payer: Self-pay | Admitting: Pulmonary Disease

## 2014-09-04 NOTE — Telephone Encounter (Signed)
Notes Recorded by Barbaraann ShareKeith M Clance, MD on 08/27/2014 at 8:54 AM Let pt know that her sputum did not grow any specific bug. We are waiting on the culture that checks for a bug in the TB family. This will take another 4 weeks. Let her know will send this message to Dr. Delton CoombesByrum as well. --------------------------------------------------------------  Reviewed above results with pt.  Let her know that the other test takes about 6 weeks total to get a final result, but that we would be contacting her with the results once we have them.  Nothing further needed at this time.

## 2014-09-18 ENCOUNTER — Ambulatory Visit (INDEPENDENT_AMBULATORY_CARE_PROVIDER_SITE_OTHER): Payer: Medicare Other | Admitting: Emergency Medicine

## 2014-09-18 ENCOUNTER — Ambulatory Visit (INDEPENDENT_AMBULATORY_CARE_PROVIDER_SITE_OTHER)
Admission: RE | Admit: 2014-09-18 | Discharge: 2014-09-18 | Disposition: A | Discharge status: Home / Self Care | Payer: Medicare Other | Source: Ambulatory Visit | Attending: Emergency Medicine | Admitting: Emergency Medicine

## 2014-09-18 VITALS — BP 134/68 | HR 111 | Ht 63.0 in | Wt 99.2 lb

## 2014-09-18 DIAGNOSIS — J479 Bronchiectasis, uncomplicated: Secondary | ICD-10-CM

## 2014-09-18 DIAGNOSIS — J441 Chronic obstructive pulmonary disease with (acute) exacerbation: Secondary | ICD-10-CM

## 2014-09-18 NOTE — Progress Notes (Signed)
Subjective:   Patient ID: Karen Costa, female    DOB: [birth year], 80 y.o.   MRN: [id number]  HPI [name] is a 80 year old woman who follows for COPD and bronchiectasis.  She also has a history of a right sided pulmonary nodule. her last CT Scan, 11/08, showed stable RLL scar, also seen on f/u CT scan 6/12. . No longer uses spiriva or advair. Last seen 6/13. She remains very active, no breathing limitations. She uses albuterol prn, about once a week. Cough under good control, allergies under good control. She is interested in Oralair > a sublingual allergy med, against 5 of th most common grasses.   ROV 05/10/14 -- hx COPD and bronchiectasis, allergies, nodular disease. She has been seen twice since our last visit for AE, last treated with levaquin x 5 days. She was primarily having dyspnea, but now also with some yellow thick mucous.   ROV 06/20/14 --  COPD and bronchiectasis, allergies, nodular disease. She has been having much more trouble over the last several months - more dyspnea, more purulent mucus. She was admitted early September for an exacerbation. She has micronodular disease that has been ascribed to possible chronic mycobacterial colonization (no cx data).  She is much improved since hospital. She has not needed to use her albuterol. She has mucous production but it is clear. She is finishing prednisone taper now, cefdinir.   ROV 09/18/14 -- follow up visit for hx bronchiectasis, suspected MAIC, COPD. She saw Dr Shelle Ironlance in 11/15 with an exacerbation. Sputum AFB at that time is negative so far.  She remains SOB, with a cough, productive. She was treated with medrol, cipro. She is on rotating abx monthly. She on symbicort, using albuterol more often, once a day.          Objective:   Physical Exam Filed Vitals:   09/18/14 1348  BP: 134/68  Pulse: 111   Gen: Pleasant, thin, in no distress,  normal affect  ENT: No lesions,  mouth clear,  oropharynx clear, no postnasal  drip  Neck: No JVD, no TMG, no carotid bruits  Lungs: coarse B, rhonchi, no wheeze. Few insp squeeks  Cardiovascular: RRR, heart sounds normal, no murmur or gallops, no peripheral edema  Musculoskeletal: No deformities, no cyanosis or clubbing  Neuro: alert, non focal  Skin: Warm, no lesions or rashes     CT chest 03/11/11  Comparison: 08/25/2007.  Findings: Lung windows demonstrate similar narrowing and  irregularity of the lobar and segmental bronchi to the right middle  lobe on image 28 of series 3.  Pleural parenchymal scarring at the right apex is unchanged.  Diffuse interstitial and reticular nodular opacities are similar.  Some areas demonstrate minimal progression. For example, a  subpleural interstitial opacity in the right lower lobe on image 39  series 3 is new. A subpleural 4 mm nodular density in the left  upper lobe on image 29 is new.  There is no dominant right upper lobe nodule or mass identified.  Bronchiectasis in the right middle lobe with volume loss is  similar. Probable scarring in the subpleural left lower lobe at 9  mm on image 39.  Soft tissue windows demonstrate cardiomegaly accentuated by pectus  deformity. No pericardial or pleural effusion. Multivessel  coronary artery atherosclerosis. No mediastinal or definite hilar  adenopathy, given limitations of unenhanced CT.  Limited abdominal imaging demonstrates right hepatic lobe cyst. No  significant findings. No acute osseous abnormality. Exaggeration  of thoracic kyphosis.  IMPRESSION:  1. Similar to minimal increase in relatively diffuse interstitial  and reticular nodular opacities. Concurrent right middle lobe  bronchiectasis and volume loss. 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  2. No evidence of dominant right upper lobe pulmonary nodule or  mass.   Assessment & Plan:  COPD with exacerbation - will repeat her CXR today, will need to  decide whether to repeat her CT scan to compare with September - We will do a trial of changing Symbicort to Stiolto to see if she benefits - will defer steroids right now, but she does have wheeze, may need to be treated next visit.  - rov 2 weeks, overbook.

## 2014-09-18 NOTE — Assessment & Plan Note (Signed)
-   will repeat her CXR today, will need to decide whether to repeat her CT scan to compare with September - We will do a trial of changing Symbicort to Stiolto to see if she benefits - will defer steroids right now, but she does have wheeze, may need to be treated next visit.  - rov 2 weeks, overbook.

## 2014-09-18 NOTE — Patient Instructions (Signed)
We will repeat your chest x-ray today Pending on results and how you are doing we may decide to repeat your CT scan next time Temporarily stop your Symbicort Start Stiolto 2 sprays once a day Follow with Dr Delton CoombesByrum in 2 weeks or sooner if you have any problems.

## 2014-09-19 ENCOUNTER — Telehealth: Payer: Self-pay | Admitting: Pulmonary Disease

## 2014-09-19 ENCOUNTER — Telehealth: Payer: Self-pay | Admitting: Emergency Medicine

## 2014-09-19 MED ORDER — TIOTROPIUM BROMIDE-OLODATEROL 2.5-2.5 MCG/ACT IN AERS: 2.0000 | INHALATION_SPRAY | Freq: Every day | RESPIRATORY_TRACT | Status: DC

## 2014-09-19 NOTE — Telephone Encounter (Signed)
Received call report from Nanette w/ [facility] 11.19.15 AFB results received >> prelim report shows an AFB was isolated; sample was sent out for identification.  May take up to 3-4 weeks  Pt seen 12.15.15 by RB for acute visit [facility]

## 2014-09-19 NOTE — Telephone Encounter (Signed)
Pt was given Anoro yesterday. She was to be given Stiolto instead. Would like for this medication to be sent to her pharmacy. This will be done.

## 2014-09-19 NOTE — Telephone Encounter (Signed)
Please advise RB thanks 

## 2014-09-19 NOTE — Telephone Encounter (Signed)
Please make sure RB knows about this.

## 2014-09-20 NOTE — Telephone Encounter (Signed)
Thanks you - she [facility], will wait for speciation.

## 2014-10-02 NOTE — Telephone Encounter (Signed)
Please check with [facility] and see if they have this speciated yet. Thanks

## 2014-10-02 NOTE — Telephone Encounter (Signed)
[facility] and spoke with Vikki PortsValerie in customer assistance AFB typically takes 6 weeks for identification - collection date was 11.19.15 so results should be back by next week Will forward back to RB to make him aware

## 2014-10-03 ENCOUNTER — Telehealth: Payer: Self-pay | Admitting: Emergency Medicine

## 2014-10-03 ENCOUNTER — Encounter: Payer: Self-pay | Admitting: Emergency Medicine

## 2014-10-03 ENCOUNTER — Ambulatory Visit (INDEPENDENT_AMBULATORY_CARE_PROVIDER_SITE_OTHER): Payer: Medicare Other | Admitting: Emergency Medicine

## 2014-10-03 VITALS — BP 140/98 | HR 110 | Ht 63.0 in | Wt 101.0 lb

## 2014-10-03 DIAGNOSIS — J479 Bronchiectasis, uncomplicated: Secondary | ICD-10-CM

## 2014-10-03 NOTE — Assessment & Plan Note (Signed)
AFB positive, not yet speciated  Stop stiolto, and restart Symbicort 2 puffs twice a day.  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 Once this is speciated for sure, we should try starting extended antibiotics to see if we can eradicate it. This will help with your bronchiectasis and your other symptoms.  Follow with Dr Delton CoombesByrum in 1 month

## 2014-10-03 NOTE — Progress Notes (Signed)
Subjective:   Patient ID: Karen FishRosalind F Costa, female    DOB: 1930/04/10, 80 y.o.   MRN: [id number]  HPI Karen Costa is a 80 year old woman who follows for COPD and bronchiectasis.  Karen Costa also has a history of a right sided pulmonary nodule. Karen Costa last CT Scan, 11/08, showed stable RLL scar, also seen on f/u CT scan 6/12. . No longer uses spiriva or advair. Last seen 6/13. Karen Costa remains very active, no breathing limitations. Karen Costa uses albuterol prn, about once a week. Cough under good control, allergies under good control. Karen Costa is interested in Oralair > a sublingual allergy med, against 5 of th most common grasses.   ROV 05/10/14 -- hx COPD and bronchiectasis, allergies, nodular disease. Karen Costa has been seen twice since our last visit for AE, last treated with levaquin x 5 days. Karen Costa was primarily having dyspnea, but now also with some yellow thick mucous.   ROV 06/20/14 --  COPD and bronchiectasis, allergies, nodular disease. Karen Costa has been having much more trouble over the last several months - more dyspnea, more purulent mucus. Karen Costa was admitted early September for an exacerbation. Karen Costa has micronodular disease that has been ascribed to possible chronic mycobacterial colonization (no cx data).  Karen Costa is much improved since hospital. Karen Costa has not needed to use Karen Costa albuterol. Karen Costa has mucous production but it is clear. Karen Costa is finishing prednisone taper now, cefdinir.   ROV 09/18/14 -- follow up visit for hx bronchiectasis, suspected MAIC, COPD. Karen Costa saw Dr Shelle Ironlance in 11/15 with an exacerbation. Sputum AFB at that time is negative so far.  Karen Costa remains SOB, with a cough, productive. Karen Costa was treated with medrol, cipro. Karen Costa is on rotating abx monthly. Karen Costa on symbicort, using albuterol more often, once a day.    ROV 10/03/14 -- follows for hx bronchiectasis, suspected MAIC, COPD. Karen Costa had sputum done that is AFB+ >> . We changed symbicort to stiolto to see if Karen Costa would prefer.  Karen Costa had more side effects, jitters on the stiolto.          Objective:   Physical Exam Filed Vitals:   10/03/14 1441  BP: 140/98  Pulse: 110   Gen: Pleasant, thin, in no distress,  normal affect  ENT: No lesions,  mouth clear,  oropharynx clear, no postnasal drip  Neck: No JVD, no TMG, no carotid bruits  Lungs: coarse B, rhonchi, no wheeze. Few insp squeeks  Cardiovascular: RRR, heart sounds normal, no murmur or gallops, no peripheral edema  Musculoskeletal: No deformities, no cyanosis or clubbing  Neuro: alert, non focal  Skin: Warm, no lesions or rashes     CT chest 03/11/11  Comparison: 08/25/2007.  Findings: Lung windows demonstrate similar narrowing and  irregularity of the lobar and segmental bronchi to the right middle  lobe on image 28 of series 3.  Pleural parenchymal scarring at the right apex is unchanged.  Diffuse interstitial and reticular nodular opacities are similar.  Some areas demonstrate minimal progression. For example, a  subpleural interstitial opacity in the right lower lobe on image 39  series 3 is new. A subpleural 4 mm nodular density in the left  upper lobe on image 29 is new.  There is no dominant right upper lobe nodule or mass identified.  Bronchiectasis in the right middle lobe with volume loss is  similar. Probable scarring in the subpleural left lower lobe at 9  mm on image 39.  Soft tissue windows demonstrate cardiomegaly accentuated by pectus  deformity. No pericardial or  pleural effusion. Multivessel  coronary artery atherosclerosis. No mediastinal or definite hilar  adenopathy, given limitations of unenhanced CT.  Limited abdominal imaging demonstrates right hepatic lobe cyst. No  significant findings. No acute osseous abnormality. Exaggeration  of thoracic kyphosis.  IMPRESSION:  1. Similar to minimal increase in relatively diffuse interstitial  and reticular nodular opacities. Concurrent right middle lobe  bronchiectasis and volume loss. 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  2. No evidence of dominant right upper lobe pulmonary nodule or  mass.   Assessment & Plan:  BRONCHIECTASIS WITHOUT ACUTE EXACERBATION AFB positive, not yet speciated  Stop stiolto, and restart Symbicort 2 puffs twice a day.  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 Once this is speciated for sure, we should try starting extended antibiotics to see if we can eradicate it. This will help with your bronchiectasis and your other symptoms.  Follow with Dr Delton CoombesByrum in 1 month

## 2014-10-03 NOTE — Telephone Encounter (Signed)
Called and spoke with [facility] and made them aware that this medication has been approved.

## 2014-10-03 NOTE — Patient Instructions (Signed)
Stop stiolto, and restart Symbicort 2 puffs twice a day.  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 Once this is speciated for sure, we should try starting extended antibiotics to see if we can eradicate it. This will help with your bronchiectasis and your other symptoms.  Follow with Dr Delton CoombesByrum in 1 month

## 2014-10-08 ENCOUNTER — Telehealth: Payer: Self-pay | Admitting: *Deleted

## 2014-10-08 NOTE — Telephone Encounter (Signed)
error 

## 2014-10-09 ENCOUNTER — Telehealth: Payer: Self-pay | Admitting: *Deleted

## 2014-10-09 NOTE — Telephone Encounter (Signed)
Error

## 2014-10-10 ENCOUNTER — Telehealth: Payer: Self-pay | Admitting: Emergency Medicine

## 2014-10-10 MED ORDER — LEVOFLOXACIN 750 MG PO TABS: 750.0000 mg | ORAL_TABLET | Freq: Every day | ORAL | Status: DC

## 2014-10-10 NOTE — Telephone Encounter (Signed)
Pt is aware of RB's recs. Rx has been sent in. Nothing further was needed.

## 2014-10-10 NOTE — Telephone Encounter (Signed)
Called, spoke with pt -  She was seen by RB on 10/03/14.  Reports SOB is unchanged from OV.  Cough and amount of mucus have increased over the past week.  States she is coughing up "tons" of mucus - heavy yellow in color.  Denies chest tightness/CP, f/c/s.  She is using proair with some relief and using symbicort.  States she knows RB is waiting on culture results but would like to know if she should start on Levaquin to help in the meantime.  She currently has levaquin 750 mg x 3 tablets at home (medication was given in Sept 2015).  RB, pls advise.  Thank you.

## 2014-10-10 NOTE — Telephone Encounter (Signed)
I think this would be reasonable to do, although ultimately we will need to start a different 3 drug regimen to treat presumed MAIC.  Have her take levaquin 750mg  daily for 7 days.

## 2014-10-17 ENCOUNTER — Telehealth: Payer: Self-pay | Admitting: Emergency Medicine

## 2014-10-17 DIAGNOSIS — J479 Bronchiectasis, uncomplicated: Secondary | ICD-10-CM

## 2014-10-17 MED ORDER — RIFABUTIN 150 MG PO CAPS: ORAL_CAPSULE | ORAL | Status: DC

## 2014-10-17 MED ORDER — ETHAMBUTOL HCL 400 MG PO TABS: ORAL_TABLET | ORAL | Status: DC

## 2014-10-17 MED ORDER — CLARITHROMYCIN 500 MG PO TABS: ORAL_TABLET | ORAL | Status: DC

## 2014-10-17 NOTE — Telephone Encounter (Signed)
Called [facility] to see why in the world we still do not have the speciation on the AFB, spoke with customer rep Tresa EndoKelly who reported that this should be answered by [facility] (tho this is the number that was called - [phone]) > she attempted to transfer call but no answer.  Will need to call [facility] tomorrow morning to check on this.  Called spoke with patient to discuss RB's recommendations with her.  Pt stated that she had spoken with RB personally who informed her of his plan.  Pt also stated that they discussed a referral to [facility].  RB please advise if okay to place this order, thanks.  [facility] pharmacist Tammy Clarithromycin 500mg  #2 caps three times weekly (Tammy reports clarithromycin does not come in 1000mg  - epic concurs) #30, refill x5 Ethambutol 400mg  #2.5 tabs three times weekly #45, refill x5 (Tammy reports Ethambutol does not come in 1000mg  - epic concurs) Rifabutin 150mg  #2 caps three times weekly #30, refill x5 (Tammy reports Rifabutin does not come in 300mg  - epic concurs) All meds will have to be ordered Med list updated

## 2014-10-17 NOTE — Telephone Encounter (Signed)
I believe that we should treat this as MAIC. The AFB still isn't speciated according to [facility]. Please order clarithromycin 1000mg  TIW + Ethambutol 1000mg  TIW + Rifabutin 300mg  TIW. She may decide to take them on different days of the week (3 times a week but not every med on the same day). We will work to get the official results from [facility]

## 2014-10-17 NOTE — Telephone Encounter (Signed)
Pt calling requesting to speak to Dr Delton CoombesByrum personally today and states that she wants a call back in a timely manner. Pt states that she has never heard the final results of her sputum culture that was collected 08/23/14 -- per records, Dr Shelle Ironlance reviewed prelim results with patient 08/27/14 and patient states that she has not heard anything else in return.  Pt c/o increased SOB, weakness, sputum production with yellow mucus. Completed Levaquin 10/16/14. Pt states that she is not getting any better and feels that she is regressing. Pt states that she has an "idea for plan of treatment" she would like to discuss with Dr Delton CoombesByrum only.  Pt sounded frustrated on the phone.  Please advise Dr Delton CoombesByrum. Thanks.

## 2014-10-18 ENCOUNTER — Other Ambulatory Visit: Payer: Self-pay | Admitting: Emergency Medicine

## 2014-10-18 NOTE — Telephone Encounter (Signed)
[name] returned call. Specimen is not finalized yet.

## 2014-10-18 NOTE — Telephone Encounter (Signed)
Karen Costa please advise where this patient can be rescheduled. Appointment was rescheduled per patient request -- stated that it was too soon per her conversation with RB and it needs to be moved out about 1 month to give her abx time to kick in. [name]'s schedule is booked up--pt refuses to see TP, requests only RB. Please advise on a day that the patient can be seen/double booked. Thanks.

## 2014-10-18 NOTE — Telephone Encounter (Signed)
Pt has been scheduled for 11/23/14 at 1:30pm. Asked if we had figured out anything about her AFB results. Advised that I would call [facility] back and find out what's going on.  Spoke with Vikki PortsValerie in [employer] at [facility]. She transferred me to the AFB lab. Then spoke with [name] and she states that they send out the sputum to [facility] on 09/12/14 to have final testing done. She is going to call [facility] and see if they have the final results. Advised her to call back to our office and let us know what is going on. Will keep message in my box to follow up on.

## 2014-10-18 NOTE — Telephone Encounter (Signed)
Pt is aware that the specimen is not finalized yet and that we will call her once it is. She agreed and verbalized understanding.  RB - please advise if you are okay with referring [facility]. Thanks.

## 2014-10-18 NOTE — Telephone Encounter (Signed)
Called to resc pt appt to 01/18 but she stated that it was too soon so we need to put her in on another day and his schedule is full

## 2014-10-19 NOTE — Telephone Encounter (Signed)
Talked to Ms Karen Costa - [facility]. I will put in the order. I told her the office would call with the referral info.

## 2014-10-20 LAB — CP MYCOBACTERIAL IDENTIFICATION

## 2014-10-22 ENCOUNTER — Telehealth: Payer: Self-pay | Admitting: Emergency Medicine

## 2014-10-22 MED ORDER — BUDESONIDE-FORMOTEROL FUMARATE 160-4.5 MCG/ACT IN AERO: 2.0000 | INHALATION_SPRAY | Freq: Two times a day (BID) | RESPIRATORY_TRACT | Status: DC

## 2014-10-22 NOTE — Telephone Encounter (Signed)
Per 10/19/14 referral: Pt would [facility] to be [facility] for [facility] regarding her probable mycobacterial disease and bronchiectasis --  [facility] please advise [facility] thanks

## 2014-10-22 NOTE — Telephone Encounter (Signed)
Rhonda with [facility] is taking care of this. Pt is aware of this as well. I called [facility] and cancelled appt with Dr Orvan Falconerampbell on 11-05-14 at 2pm. Nothing more needed at this time.

## 2014-10-22 NOTE — Telephone Encounter (Signed)
Just place a new order for [facility] Amis[facility]AN[facility] Sally E Ottinger

## 2014-10-22 NOTE — Telephone Encounter (Signed)
Rx has been sent in. Nothing further was needed. 

## 2014-10-23 ENCOUNTER — Ambulatory Visit: Payer: Medicare Other | Admitting: Emergency Medicine

## 2014-10-29 ENCOUNTER — Other Ambulatory Visit: Payer: Self-pay | Admitting: Emergency Medicine

## 2014-10-30 ENCOUNTER — Telehealth: Payer: Self-pay | Admitting: *Deleted

## 2014-10-30 NOTE — Telephone Encounter (Signed)
Please advise RB thanks 

## 2014-10-30 NOTE — Telephone Encounter (Signed)
Called [facility], they do not have the culture and did not run a sensitivity because it was "normal flora".

## 2014-10-30 NOTE — Telephone Encounter (Signed)
[facility] should be running sensitivities on the same samples (not a new order). They usually do it, but please give them a call to make sure they are working on this. Thanks.

## 2014-10-30 NOTE — Telephone Encounter (Signed)
RB- I see no future or pending labs for this patient, specifically any sensitivity.

## 2014-10-30 NOTE — Telephone Encounter (Signed)
This is Dr. Kavin LeechByrum's pt.  Make sure he is aware.

## 2014-10-30 NOTE — Telephone Encounter (Signed)
The number I have for [facility] that I called was [phone].

## 2014-10-30 NOTE — Telephone Encounter (Signed)
Acid Fast Smear No Acid Fast Bacilli Seen   Organism ID, Bacteria MYCOBACTERIUM AVIUM-INTRACELLULARE COMPLEX   Comments: IDENTIFICATION TO FOLLOW  Critical Results Called to,Read Back By  and Verified With:  JESSICA RN @ 1523 ON 09/19/14 BY [facility]  Critical Results Called to,Read Back By  and Verified With:  DENISE RN @ 12:59 PM ON 10/30/14 BY [facility]     Resulting Agency [facility]    Narrative     Performed at: [facility]        [address], [address]        [place], [place] [address]    Specimen Collected: 08/23/14 4:38 AM Last Resulted: 10/30/14 1:08 PM   [facility]'s the [facility]. Not normal flora. They should be working on sensitivities for this result. Send me the contact phone number if they are unable to tell you whether they are doing this. Thanks

## 2014-10-30 NOTE — Telephone Encounter (Signed)
Thanks - started her on [facility] therapy recently, will await sensitivities. Please make sure they are going to run sensitivities

## 2014-10-30 NOTE — Telephone Encounter (Signed)
[facility] order sputum AFB on pt 08/24/15. This was positive for microbacterium avium intracellular complex.  Please advise thanks

## 2014-11-02 ENCOUNTER — Telehealth: Payer: Self-pay | Admitting: Emergency Medicine

## 2014-11-02 NOTE — Telephone Encounter (Signed)
Yes clarithromycin can cause GI issues sorry seeing this only now. Please call Dr Delton CoombesByrum in box  - I do not treat MAI much

## 2014-11-02 NOTE — Telephone Encounter (Signed)
Spoke with pt, states she took clarithromycin yesterday per RB's recs, felt "yucky" after taking it, noted some abdominal pressure.  Had 4 bowel movements yesterday and 1 today, pt noticed small amounts of bright red blood on her toilet paper.  Pt is currently taking clarithromycin, rifabutin, and myambutol 3 times weekly for [facility] since 10/17/14.  Pt is requesting recs.  Also, pt is requesting detailed sputum culture results.    Sending to doc of day as RB is not available until 3 this afternoon.  MR please advise.  Thank you!

## 2014-11-02 NOTE — Telephone Encounter (Signed)
Spoke with pt- states that she is also taking Rifabutin which can cause GI issues as well. Could all these be causing GI Upset?   Per Byrum Stop all 3 abx until Monday. Call and give an update to let us know if symptoms have improved.  Per Dr Delton CoombesByrum, bleeding could be coming from irritated bowels from having so many bowel movements. Constipation and diarrhea can also cause some slight bleeding.  Dr Delton CoombesByrum states that he does not feel that this type of bleeding she is having is from a reaction to the abx's - pt states that she disagrees, pt states that the way she feels she thinks strongly that this is a reaction to the medications. I explained to the patient again that the best way to know is to stop all three abx's and see how she feels in a few days. We can discuss next week the result.  Aware to go to ED if symptoms worsen over night/weekend. Pt to call on Monday. Nothing further needed.

## 2014-11-02 NOTE — Telephone Encounter (Signed)
Spoke with [facility], they will eval sensitivities. Will probably be ready in 2-3 weeks.

## 2014-11-05 ENCOUNTER — Ambulatory Visit: Payer: Self-pay | Admitting: Internal Medicine

## 2014-11-05 ENCOUNTER — Telehealth: Payer: Self-pay | Admitting: Emergency Medicine

## 2014-11-05 NOTE — Telephone Encounter (Signed)
Called spoke with pt. Aware of recs.  Nothing further needed 

## 2014-11-05 NOTE — Telephone Encounter (Signed)
Have her start back only the clarithro per the prior schedule. We'll follow to see if she changes

## 2014-11-05 NOTE — Telephone Encounter (Signed)
Spoke with pt. Karen Costa advised her to stop clarithromycin, rifabutin, and myambutol 3 times weekly for [facility] due to blood in her stool. Since stopping these medication there is no more blood in her stool. Would like to know how to proceed now.  Karen Costa - please advise. Thanks.

## 2014-11-12 ENCOUNTER — Telehealth: Payer: Self-pay | Admitting: Emergency Medicine

## 2014-11-12 NOTE — Telephone Encounter (Signed)
Pt calling, wanting to know when she will be able to add back in her Rifabutin 150mg  and Myambutol 400mg  to her weekly regimen. Pt states that she is doing well on the Clarithromycin as far as she can tell. Pt aware that RB in office this afternoon and we will call her back with rec's.   Please advise Dr Delton CoombesByrum. Thanks.

## 2014-11-12 NOTE — Telephone Encounter (Signed)
She can restart the ethambutol now per her previous instructions. Do not start the rifabutin until we see how she tolerates the other combination

## 2014-11-12 NOTE — Telephone Encounter (Signed)
Pt aware of rec's per Dr Delton CoombesByrum.  Pt asking for final results of sputum culture - spoke with Dr Delton CoombesByrum, states that we have not received the sensitivities as of yet and as soon as those are received we will call her and give her recommendations. Pt expressed understanding. Nothing further needed.

## 2014-11-23 ENCOUNTER — Encounter: Payer: Self-pay | Admitting: Emergency Medicine

## 2014-11-23 ENCOUNTER — Ambulatory Visit (INDEPENDENT_AMBULATORY_CARE_PROVIDER_SITE_OTHER): Payer: Medicare Other | Admitting: Emergency Medicine

## 2014-11-23 VITALS — BP 122/82 | HR 118 | Ht 63.0 in | Wt 97.0 lb

## 2014-11-23 DIAGNOSIS — J479 Bronchiectasis, uncomplicated: Secondary | ICD-10-CM

## 2014-11-23 DIAGNOSIS — A310 Pulmonary mycobacterial infection: Secondary | ICD-10-CM

## 2014-11-23 NOTE — Assessment & Plan Note (Signed)
Antibiotics adjusted as above

## 2014-11-23 NOTE — Patient Instructions (Signed)
Please continue your clarithromycin and ethambutol as you have been taking them Try restarting the rifabutin to see if you have side effects. If you develop upset stomach, diarrhea then we will stop it  Follow with Dr Delton CoombesByrum in 3 months or sooner if you have any problems.

## 2014-11-23 NOTE — Progress Notes (Signed)
Subjective:   Patient ID: Karen Costa, female    DOB: June 27, 1930, 81 y.o.   MRN: [id number]  HPI Karen Costa is a 81 year old woman who follows for COPD and bronchiectasis.  She also has a history of a right sided pulmonary nodule. her last CT Scan, 11/08, showed stable RLL scar, also seen on f/u CT scan 6/12. . No longer uses spiriva or advair. Last seen 6/13. She remains very active, no breathing limitations. She uses albuterol prn, about once a week. Cough under good control, allergies under good control. She is interested in Oralair > a sublingual allergy med, against 5 of th most common grasses.   ROV 05/10/14 -- hx COPD and bronchiectasis, allergies, nodular disease. She has been seen twice since our last visit for AE, last treated with levaquin x 5 days. She was primarily having dyspnea, but now also with some yellow thick mucous.   ROV 06/20/14 --  COPD and bronchiectasis, allergies, nodular disease. She has been having much more trouble over the last several months - more dyspnea, more purulent mucus. She was admitted early September for an exacerbation. She has micronodular disease that has been ascribed to possible chronic mycobacterial colonization (no cx data).  She is much improved since hospital. She has not needed to use her albuterol. She has mucous production but it is clear. She is finishing prednisone taper now, cefdinir.   ROV 09/18/14 -- follow up visit for hx bronchiectasis, suspected MAIC, COPD. She saw Dr Shelle Iron in 11/15 with an exacerbation. Sputum AFB at that time is negative so far.  She remains SOB, with a cough, productive. She was treated with medrol, cipro. She is on rotating abx monthly. She on symbicort, using albuterol more often, once a day.    ROV 10/03/14 -- follows for hx bronchiectasis, suspected MAIC, COPD. She had sputum done that is AFB+ >> . We changed symbicort to stiolto to see if she would prefer.  She had more side effects, jitters on the stiolto.    ROV 11/23/14 -- follow up visit for bronchiectasis and MAIC, COPD.  She had nausea, diarrhea and possible blood in her stool on clarithro, ethambutol,  rifabutin so we stopped these. The restarted in series to see how she tolerates. She is currently on clarithro + ethambutol, not yet on rifabutin. Tolerating well thus far, no diarrhea. She has seen ID at [facility]. She is using proair 1-2x a day. She remains on symbicort         Objective:   Physical Exam Filed Vitals:   11/23/14 1328  BP: 122/82  Pulse: 118   Gen: Pleasant, thin, in no distress,  normal affect  ENT: No lesions,  mouth clear,  oropharynx clear, no postnasal drip  Neck: No JVD, no TMG, no carotid bruits  Lungs: coarse B, rhonchi, no wheeze. Few insp squeeks  Cardiovascular: RRR, heart sounds normal, no murmur or gallops, no peripheral edema  Musculoskeletal: No deformities, no cyanosis or clubbing  Neuro: alert, non focal  Skin: Warm, no lesions or rashes     CT chest 03/11/11  Comparison: 08/25/2007.  Findings: Lung windows demonstrate similar narrowing and  irregularity of the lobar and segmental bronchi to the right middle  lobe on image 28 of series 3.  Pleural parenchymal scarring at the right apex is unchanged.  Diffuse interstitial and reticular nodular opacities are similar.  Some areas demonstrate minimal progression. For example, a  subpleural interstitial opacity in the right lower lobe on image 39  series 3 is new. A subpleural 4 mm nodular density in the left  upper lobe on image 29 is new.  There is no dominant right upper lobe nodule or mass identified.  Bronchiectasis in the right middle lobe with volume loss is  similar. Probable scarring in the subpleural left lower lobe at 9  mm on image 39.  Soft tissue windows demonstrate cardiomegaly accentuated by pectus  deformity. No pericardial or pleural effusion. Multivessel  coronary artery atherosclerosis. No mediastinal or definite hilar   adenopathy, given limitations of unenhanced CT.  Limited abdominal imaging demonstrates right hepatic lobe cyst. No  significant findings. No acute osseous abnormality. Exaggeration  of thoracic kyphosis.  IMPRESSION:  1. Similar to minimal increase in relatively diffuse interstitial  and reticular nodular opacities. Concurrent right middle lobe  bronchiectasis and volume loss. 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  2. No evidence of dominant right upper lobe pulmonary nodule or  mass.   Assessment & Plan:  BRONCHIECTASIS WITHOUT ACUTE EXACERBATION Cough is improving. Her mucus is less and appears to be less colored. She has been able to restart her clarithromycin and ethambutol without developing any side effects. We will now try to restart her rifabutin. If she develops diarrhea or upset stomach and we will stop this medicine and continue her on a 2 drug regimen. She is seeing infectious diseases at [facility] and will continue to follow with them. She will continue her Symbicort and her albuterol as needed. Follow-up in 3 months   MAIC (mycobacterium avium-intracellulare complex) Antibiotics adjusted as above

## 2014-11-23 NOTE — Assessment & Plan Note (Signed)
Cough is improving. Her mucus is less and appears to be less colored. She has been able to restart her clarithromycin and ethambutol without developing any side effects. We will now try to restart her rifabutin. If she develops diarrhea or upset stomach and we will stop this medicine and continue her on a 2 drug regimen. She is seeing infectious diseases at [facility] and will continue to follow with them. She will continue her Symbicort and her albuterol as needed. Follow-up in 3 months

## 2014-11-26 LAB — AFB CULTURE WITH SMEAR (NOT AT ARMC): ACID FAST SMEAR: NONE SEEN

## 2014-11-30 ENCOUNTER — Telehealth: Payer: Self-pay | Admitting: Emergency Medicine

## 2014-11-30 NOTE — Telephone Encounter (Signed)
Called and spoke to pt. Pt stated she has recently had diarrhea d/t Rifabutin and has decided she is stopping the medication and wanted to relay the information to RB.    Patient Instructions     Please continue your clarithromycin and ethambutol as you have been taking them Try restarting the rifabutin to see if you have side effects. If you develop upset stomach, diarrhea then we will stop it  Follow with Dr Delton CoombesByrum in 3 months or sooner if you have any problems.     Will send to RB as FYI.

## 2014-11-30 NOTE — Telephone Encounter (Signed)
OK - thanks

## 2014-12-14 ENCOUNTER — Telehealth: Payer: Self-pay | Admitting: Emergency Medicine

## 2014-12-14 NOTE — Telephone Encounter (Signed)
It is not due to clarithromcyine, could be related to over use of albtuerol so rec she only use it sparingly and if this is already the case needs to see her primary doctor about it or go to [facility]

## 2014-12-14 NOTE — Telephone Encounter (Signed)
Called made pt aware of recs. Nothing further needed 

## 2014-12-14 NOTE — Telephone Encounter (Signed)
Spoke with pt, states her heart is beating rapidly, sob constantly, prod cough with clear mucus since yesterday.  Pt believes this is d/t her clarithromycin, which she has been on since October 17 2014.  Pt does not have a cardiologist.   MW please advise in RB's absence.  Thanks!

## 2015-02-25 ENCOUNTER — Ambulatory Visit (INDEPENDENT_AMBULATORY_CARE_PROVIDER_SITE_OTHER): Payer: Medicare Other | Admitting: Emergency Medicine

## 2015-02-25 ENCOUNTER — Encounter: Payer: Self-pay | Admitting: Emergency Medicine

## 2015-02-25 VITALS — BP 130/90 | HR 113 | Ht 63.0 in | Wt 92.0 lb

## 2015-02-25 DIAGNOSIS — J479 Bronchiectasis, uncomplicated: Secondary | ICD-10-CM

## 2015-02-25 DIAGNOSIS — J309 Allergic rhinitis, unspecified: Secondary | ICD-10-CM

## 2015-02-25 DIAGNOSIS — A310 Pulmonary mycobacterial infection: Secondary | ICD-10-CM

## 2015-02-25 NOTE — Assessment & Plan Note (Signed)
Greatly appreciate the input of infectious diseases at [facility]. Most recently her rifabutin was changed to rifampin. She tells me that she has been tolerating this more easily. She remains on clarithromycin and ethambutol. [place]. I will collaborate with them to decide when she needs repeat imaging and how long to continue therapy

## 2015-02-25 NOTE — Patient Instructions (Signed)
Please continue your ethambutol, clarithromycin, rifampin as directed by infectious diseases clinic Please continue Symbicort as you have been taking it Keep albuterol available to use as needed Continue Nasacort as you have been taking it Start loratadine 10 mg daily. A prescription has been sent to your pharmacy Follow with Dr Delton CoombesByrum in 3 months or sooner if you have any problems.

## 2015-02-25 NOTE — Assessment & Plan Note (Signed)
More symptomatic over the last 2 weeks. She has been using her Nasacort but not every day. I asked her to change this to every day on a schedule. I will also add loratadine 10 mg daily for her to take at least during the allergy season.

## 2015-02-25 NOTE — Progress Notes (Signed)
Subjective:   Patient ID: Karen Costa, female    DOB: [birth year], 81 y.o.   MRN: [id number]  HPI Karen Costa is a 81 year old woman who follows for COPD and bronchiectasis.  She also has a history of a right sided pulmonary nodule. her last CT Scan, 11/08, showed stable RLL scar, also seen on f/u CT scan 6/12. . No longer uses spiriva or advair. Last seen 6/13. She remains very active, no breathing limitations. She uses albuterol prn, about once a week. Cough under good control, allergies under good control. She is interested in Oralair > a sublingual allergy med, against 5 of th most common grasses.   ROV 05/10/14 -- hx COPD and bronchiectasis, allergies, nodular disease. She has been seen twice since our last visit for AE, last treated with levaquin x 5 days. She was primarily having dyspnea, but now also with some yellow thick mucous.   ROV 06/20/14 --  COPD and bronchiectasis, allergies, nodular disease. She has been having much more trouble over the last several months - more dyspnea, more purulent mucus. She was admitted early September for an exacerbation. She has micronodular disease that has been ascribed to possible chronic mycobacterial colonization (no cx data).  She is much improved since hospital. She has not needed to use her albuterol. She has mucous production but it is clear. She is finishing prednisone taper now, cefdinir.   ROV 09/18/14 -- follow up visit for hx bronchiectasis, suspected MAIC, COPD. She saw Dr Shelle Iron in 11/15 with an exacerbation. Sputum AFB at that time is negative so far.  She remains SOB, with a cough, productive. She was treated with medrol, cipro. She is on rotating abx monthly. She on symbicort, using albuterol more often, once a day.    ROV 10/03/14 -- follows for hx bronchiectasis, suspected MAIC, COPD. She had sputum done that is AFB+ >> . We changed symbicort to stiolto to see if she would prefer.  She had more side effects, jitters on the stiolto.    ROV 11/23/14 -- follow up visit for bronchiectasis and MAIC, COPD.  She had nausea, diarrhea and possible blood in her stool on clarithro, ethambutol,  rifabutin so we stopped these. The restarted in series to see how she tolerates. She is currently on clarithro + ethambutol, not yet on rifabutin. Tolerating well thus far, no diarrhea. She has seen ID at [facility]. She is using proair 1-2x a day. She remains on symbicort   ROV 02/25/15 -- follow-up visit for COPD and mycobacterial disease. She has been followed in the infectious disease clinic at [facility]. Most recently they have recommended changing the rifabutin to rifampin, staying on the clarithro and ethambutol.  She has been on abx for 4 months. They are collecting sputua at [facility]. She is on Symbicort. A little more cough last few weeks, clear sputum. She relates this to an increase in allergy exposure. She uses albuterol only rarely.                              Objective:   Physical Exam Filed Vitals:   02/25/15 1422 02/25/15 1423  BP:  130/90  Pulse:  113  Height:  (1.6 m)   Weight: 92 lb (41.731 kg)   SpO2:  91%   Gen: Pleasant, thin, in no distress,  normal affect  ENT: No lesions,  mouth clear,  oropharynx clear, no postnasal drip  Neck: No JVD, no TMG,  no carotid bruits  Lungs: no wheeze or rhonchi, improved today compared with last visit  Cardiovascular: RRR, heart sounds normal, no murmur or gallops, no peripheral edema  Musculoskeletal: No deformities, no cyanosis or clubbing  Neuro: alert, non focal  Skin: Warm, no lesions or rashes    CT chest 06/12/14 --  COMPARISON: Chest x-ray 06/10/2014. Chest CT 03/13/2011.  FINDINGS: Right apical and peripheral right upper lobe nodular densities are noted, stable since prior CT compatible with scarring. Interstitial prominence and clustered micro and macro nodularity noted in the right upper lobe, also stable since 2012 compatible with sequela of old  infection/inflammation. Similar peripheral nodular densities in the right lower lobe, stable.  New cavitary area in the right lower lobe measuring 18 mm. Overlying pleural thickening. New nodular area along the right diaphragm in the right lower lobe on image 50 measuring 17 mm.  Peripheral nodular densities in the left lower lobe have increased since 2012, most likely infectious/ inflammatory. 8 mm peripheral nodule on image 39 in the left lower lobe is stable.  No pleural effusions.  Areas of bronchiectasis noted in the right upper lobe and right lower lobe, stable. No mediastinal, hilar, or axillary adenopathy. Chest wall soft tissues are unremarkable. Heart is normal size. Scattered aortic and coronary artery calcifications. Imaging into the upper abdomen shows no acute findings. No acute bony abnormality.  IMPRESSION: Right apical densities are stable since 2012 compatible with scarring. Peripheral interstitial thickening and nodularity in the right upper lobe and right lower lobe are stable since 2012. Increasing peripheral clustered nodules in the left lower lobe, likely infectious/inflammatory. 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  New cavitary area in the right lower lobe measuring 17 mm. This is not demonstrate suspicious nodularity and is also most likely related to chronic mycobacterium infection.  New nodular area lung the right hemidiaphragm measuring 17 mm. Favor postinflammatory as well. This could be followed with repeat CT in 6 months.    Assessment & Plan:  BRONCHIECTASIS WITHOUT ACUTE EXACERBATION Appears to be clinically stable at this time but does have daily symptoms. She has some exertional limitation that is keeping her from doing some of her gardening. She has been coughing more over the last 2 weeks with more clear sputum. She also clears some purulent mucus every day. I suspect that her allergic rhinitis  these to be treated a bit more aggressively as indicated. Will continue Symbicort and albuterol when necessary   Allergic rhinitis More symptomatic over the last 2 weeks. She has been using her Nasacort but not every day. I asked her to change this to every day on a schedule. I will also add loratadine 10 mg daily for her to take at least during the allergy season.    MAIC (mycobacterium avium-intracellulare complex) Greatly appreciate the input of infectious diseases at [facility]. Most recently her rifabutin was changed to rifampin. She tells me that she has been tolerating this more easily. She remains on clarithromycin and ethambutol. They are [place]. I will collaborate with them to decide when she needs repeat imaging and how long to continue therapy

## 2015-02-25 NOTE — Assessment & Plan Note (Signed)
Appears to be clinically stable at this time but does have daily symptoms. She has some exertional limitation that is keeping her from doing some of her gardening. She has been coughing more over the last 2 weeks with more clear sputum. She also clears some purulent mucus every day. I suspect that her allergic rhinitis these to be treated a bit more aggressively as indicated. Will continue Symbicort and albuterol when necessary

## 2015-04-25 ENCOUNTER — Other Ambulatory Visit: Payer: Self-pay | Admitting: Emergency Medicine

## 2015-04-26 ENCOUNTER — Telehealth: Payer: Self-pay | Admitting: Emergency Medicine

## 2015-04-26 MED ORDER — ETHAMBUTOL HCL 400 MG PO TABS: ORAL_TABLET | ORAL | Status: DC

## 2015-04-26 MED ORDER — CLARITHROMYCIN 500 MG PO TABS: ORAL_TABLET | ORAL | Status: DC

## 2015-04-26 NOTE — Telephone Encounter (Signed)
RX's have been sent in. Nothing further needed

## 2015-06-06 ENCOUNTER — Ambulatory Visit (INDEPENDENT_AMBULATORY_CARE_PROVIDER_SITE_OTHER): Payer: Medicare Other | Admitting: Emergency Medicine

## 2015-06-06 ENCOUNTER — Encounter: Payer: Self-pay | Admitting: Emergency Medicine

## 2015-06-06 VITALS — BP 130/82 | HR 92 | Ht 63.0 in | Wt 92.0 lb

## 2015-06-06 DIAGNOSIS — A310 Pulmonary mycobacterial infection: Secondary | ICD-10-CM | POA: Diagnosis not present

## 2015-06-06 DIAGNOSIS — J479 Bronchiectasis, uncomplicated: Secondary | ICD-10-CM | POA: Diagnosis not present

## 2015-06-06 NOTE — Assessment & Plan Note (Addendum)
Tolerating clarithromycin and ethambutol and rifampin, being managed at infectious diseases [facility]. I don't have any plans to repeat her imaging at this time. I would like to have her complete her antibiotic regimen and and then reimage

## 2015-06-06 NOTE — Patient Instructions (Signed)
Please continue your Symbicort twice a day.  Continue to use your albuterol as needed Continue your antibiotics as directed by by the Infectious Diseases Clinic.  Follow with Dr Delton Coombes in 4 months or sooner if you have any problems.

## 2015-06-06 NOTE — Progress Notes (Signed)
Subjective:   Patient ID: Karen Costa, female    DOB: 1930-05-07, 81 y.o.   MRN: [id number]  HPI Karen Costa is a 81 year old woman who follows for COPD and bronchiectasis.  She also has a history of a right sided pulmonary nodule. her last CT Scan, 11/08, showed stable RLL scar, also seen on f/u CT scan 6/12. . No longer uses spiriva or advair. Last seen 6/13. She remains very active, no breathing limitations. She uses albuterol prn, about once a week. Cough under good control, allergies under good control. She is interested in Oralair > a sublingual allergy med, against 5 of th most common grasses.   ROV 05/10/14 -- hx COPD and bronchiectasis, allergies, nodular disease. She has been seen twice since our last visit for AE, last treated with levaquin x 5 days. She was primarily having dyspnea, but now also with some yellow thick mucous.   ROV 06/20/14 --  COPD and bronchiectasis, allergies, nodular disease. She has been having much more trouble over the last several months - more dyspnea, more purulent mucus. She was admitted early September for an exacerbation. She has micronodular disease that has been ascribed to possible chronic mycobacterial colonization (no cx data).  She is much improved since hospital. She has not needed to use her albuterol. She has mucous production but it is clear. She is finishing prednisone taper now, cefdinir.   ROV 09/18/14 -- follow up visit for hx bronchiectasis, suspected MAIC, COPD. She saw Dr Shelle Iron in 11/15 with an exacerbation. Sputum AFB at that time is negative so far.  She remains SOB, with a cough, productive. She was treated with medrol, cipro. She is on rotating abx monthly. She on symbicort, using albuterol more often, once a day.    ROV 10/03/14 -- follows for hx bronchiectasis, suspected MAIC, COPD. She had sputum done that is AFB+ >> . We changed symbicort to stiolto to see if she would prefer.  She had more side effects, jitters on the stiolto.    ROV 11/23/14 -- follow up visit for bronchiectasis and MAIC, COPD.  She had nausea, diarrhea and possible blood in her stool on clarithro, ethambutol,  rifabutin so we stopped these. The restarted in series to see how she tolerates. She is currently on clarithro + ethambutol, not yet on rifabutin. Tolerating well thus far, no diarrhea. She has seen ID at [facility]. She is using proair 1-2x a day. She remains on symbicort   ROV 02/25/15 -- follow-up visit for COPD and mycobacterial disease. She has been followed in the infectious disease clinic at [facility]. Most recently they have recommended changing the rifabutin to rifampin, staying on the clarithro and ethambutol.  She has been on abx for 4 months. They are collecting sputua at [facility]. She is on Symbicort. A little more cough last few weeks, clear sputum. She relates this to an increase in allergy exposure. She uses albuterol only rarely.             ROV 06/06/15 --  This is a follow-up visit for history of bronchiectasis in the setting of mycobacterial disease, also obstructive lung disease and allergic rhinitis. She has been on therapy for Mycobacterium avium as directed by the infectious diseases department at [facility]. She is to see them next week, has been on rx x 8 months. Her allergies and cough are not very active right now, less discolored sputum. Her breathing is stable. Some occasional wheeze.  She remains on Symbicort.  Objective:   Physical Exam Filed Vitals:   06/06/15 1359  BP: 130/82  Pulse: 92  Height: 5\' 3"  (1.6 m)  Weight: 92 lb (41.731 kg)  SpO2: 91%   Gen: Pleasant, thin, in no distress,  normal affect  ENT: No lesions,  mouth clear,  oropharynx clear, no postnasal drip  Neck: No JVD, no TMG, no carotid bruits  Lungs: no wheeze or rhonchi, improved today compared with last visit  Cardiovascular: RRR, heart sounds normal, no murmur or gallops, no peripheral edema  Musculoskeletal: No deformities, no cyanosis  or clubbing  Neuro: alert, non focal  Skin: Warm, no lesions or rashes    CT chest 06/12/14 --  COMPARISON: Chest x-ray 06/10/2014. Chest CT 03/13/2011.  FINDINGS: Right apical and peripheral right upper lobe nodular densities are noted, stable since prior CT compatible with scarring. Interstitial prominence and clustered micro and macro nodularity noted in the right upper lobe, also stable since 2012 compatible with sequela of old infection/inflammation. Similar peripheral nodular densities in the right lower lobe, stable.  New cavitary area in the right lower lobe measuring 18 mm. Overlying pleural thickening. New nodular area along the right diaphragm in the right lower lobe on image 50 measuring 17 mm.  Peripheral nodular densities in the left lower lobe have increased since 2012, most likely infectious/ inflammatory. 8 mm peripheral nodule on image 39 in the left lower lobe is stable.  No pleural effusions.  Areas of bronchiectasis noted in the right upper lobe and right lower lobe, stable. No mediastinal, hilar, or axillary adenopathy. Chest wall soft tissues are unremarkable. Heart is normal size. Scattered aortic and coronary artery calcifications. Imaging into the upper abdomen shows no acute findings. No acute bony abnormality.  IMPRESSION: Right apical densities are stable since 2012 compatible with scarring. Peripheral interstitial thickening and nodularity in the right upper lobe and right lower lobe are stable since 2012. Increasing peripheral clustered nodules in the left lower lobe, likely infectious/inflammatory. 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  New cavitary area in the right lower lobe measuring 17 mm. This is not demonstrate suspicious nodularity and is also most likely related to chronic mycobacterium infection.  New nodular area lung the right hemidiaphragm measuring 17 mm. Favor postinflammatory as  well. This could be followed with repeat CT in 6 months.    Assessment & Plan:  MAIC (mycobacterium avium-intracellulare complex) Tolerating clarithromycin and ethambutol and rifampin, being managed at infectious diseases [facility]. I don't have any plans to repeat her imaging at this time. I would like to have her complete her antibiotic regimen and and then reimage  BRONCHIECTASIS WITHOUT ACUTE EXACERBATION Stable symptoms at this time. She appears to be benefiting from an tolerating Symbicort. Her cough and sputum production are well managed effectively are better now that she is on a good antibiotic regimen

## 2015-06-06 NOTE — Assessment & Plan Note (Signed)
Stable symptoms at this time. She appears to be benefiting from an tolerating Symbicort. Her cough and sputum production are well managed effectively are better now that she is on a good antibiotic regimen

## 2015-09-18 ENCOUNTER — Encounter (HOSPITAL_COMMUNITY): Payer: Self-pay | Admitting: Emergency Medicine

## 2015-09-18 ENCOUNTER — Emergency Department (HOSPITAL_COMMUNITY)
Admission: EM | Admit: 2015-09-18 | Discharge: 2015-09-18 | Disposition: A | Discharge status: Home / Self Care | Payer: Medicare Other | Attending: Emergency Medicine | Admitting: Emergency Medicine

## 2015-09-18 ENCOUNTER — Emergency Department (HOSPITAL_COMMUNITY): Payer: Medicare Other

## 2015-09-18 DIAGNOSIS — Z7951 Long term (current) use of inhaled steroids: Secondary | ICD-10-CM | POA: Insufficient documentation

## 2015-09-18 DIAGNOSIS — R0602 Shortness of breath: Secondary | ICD-10-CM | POA: Diagnosis present

## 2015-09-18 DIAGNOSIS — J441 Chronic obstructive pulmonary disease with (acute) exacerbation: Secondary | ICD-10-CM | POA: Diagnosis not present

## 2015-09-18 DIAGNOSIS — K219 Gastro-esophageal reflux disease without esophagitis: Secondary | ICD-10-CM | POA: Insufficient documentation

## 2015-09-18 DIAGNOSIS — M810 Age-related osteoporosis without current pathological fracture: Secondary | ICD-10-CM | POA: Diagnosis not present

## 2015-09-18 DIAGNOSIS — Z7982 Long term (current) use of aspirin: Secondary | ICD-10-CM | POA: Insufficient documentation

## 2015-09-18 DIAGNOSIS — Z87891 Personal history of nicotine dependence: Secondary | ICD-10-CM | POA: Insufficient documentation

## 2015-09-18 DIAGNOSIS — M1990 Unspecified osteoarthritis, unspecified site: Secondary | ICD-10-CM | POA: Diagnosis not present

## 2015-09-18 DIAGNOSIS — Z79899 Other long term (current) drug therapy: Secondary | ICD-10-CM | POA: Insufficient documentation

## 2015-09-18 LAB — COMPREHENSIVE METABOLIC PANEL
ALK PHOS: 82 U/L (ref 38–126)
ALT: 26 U/L (ref 14–54)
AST: 28 U/L (ref 15–41)
Albumin: 4.3 g/dL (ref 3.5–5.0)
Anion gap: 11 (ref 5–15)
BUN: 14 mg/dL (ref 6–20)
CALCIUM: 9.9 mg/dL (ref 8.9–10.3)
CO2: 24 mmol/L (ref 22–32)
CREATININE: 0.56 mg/dL (ref 0.44–1.00)
Chloride: 102 mmol/L (ref 101–111)
Glucose, Bld: 140 mg/dL — ABNORMAL HIGH (ref 65–99)
Potassium: 4.6 mmol/L (ref 3.5–5.1)
SODIUM: 137 mmol/L (ref 135–145)
Total Bilirubin: 0.7 mg/dL (ref 0.3–1.2)
Total Protein: 7.8 g/dL (ref 6.5–8.1)

## 2015-09-18 LAB — TROPONIN I

## 2015-09-18 LAB — CBC WITH DIFFERENTIAL/PLATELET
Basophils Absolute: 0 10*3/uL (ref 0.0–0.1)
Basophils Relative: 0 %
Eosinophils Absolute: 0 10*3/uL (ref 0.0–0.7)
Eosinophils Relative: 0 %
HEMATOCRIT: 44.4 % (ref 36.0–46.0)
HEMOGLOBIN: 14.9 g/dL (ref 12.0–15.0)
LYMPHS PCT: 5 %
Lymphs Abs: 0.6 10*3/uL — ABNORMAL LOW (ref 0.7–4.0)
MCH: 32.3 pg (ref 26.0–34.0)
MCHC: 33.6 g/dL (ref 30.0–36.0)
MCV: 96.1 fL (ref 78.0–100.0)
MONOS PCT: 2 %
Monocytes Absolute: 0.2 10*3/uL (ref 0.1–1.0)
NEUTROS ABS: 11.2 10*3/uL — AB (ref 1.7–7.7)
NEUTROS PCT: 93 %
Platelets: 293 10*3/uL (ref 150–400)
RBC: 4.62 MIL/uL (ref 3.87–5.11)
RDW: 13.1 % (ref 11.5–15.5)
WBC: 12 10*3/uL — AB (ref 4.0–10.5)

## 2015-09-18 LAB — BRAIN NATRIURETIC PEPTIDE: B Natriuretic Peptide: 169.3 pg/mL — ABNORMAL HIGH (ref 0.0–100.0)

## 2015-09-18 MED ORDER — METHYLPREDNISOLONE SODIUM SUCC 125 MG IJ SOLR: 125.0000 mg | Freq: Once | INTRAMUSCULAR | Status: AC

## 2015-09-18 MED ORDER — DEXAMETHASONE 4 MG PO TABS: 4.0000 mg | ORAL_TABLET | Freq: Every day | ORAL | Status: DC

## 2015-09-18 MED ORDER — ALBUTEROL SULFATE (2.5 MG/3ML) 0.083% IN NEBU
5.0000 mg | INHALATION_SOLUTION | Freq: Once | RESPIRATORY_TRACT | Status: AC
  Administered 2015-09-18: 5 mg via RESPIRATORY_TRACT
  Filled 2015-09-18: qty 6

## 2015-09-18 MED ORDER — IPRATROPIUM BROMIDE 0.02 % IN SOLN
0.5000 mg | Freq: Once | RESPIRATORY_TRACT | Status: AC
  Administered 2015-09-18: 0.5 mg via RESPIRATORY_TRACT
  Filled 2015-09-18: qty 2.5

## 2015-09-18 NOTE — Discharge Instructions (Signed)

## 2015-09-18 NOTE — ED Notes (Addendum)
Patient reports SOB, COPD exacerbation x1 day. Expiratory wheezing bilaterally in upper and lower lobes. 98% RA. Productive cough with yellow sputum. Denies pain, fever or chills.  22g right forearm. 125mg  solu-medrol, 1 duoneb in route.

## 2015-09-18 NOTE — ED Notes (Signed)
Discharge instructions and rx x1 reviewed with patient. Patient verbalized understanding 

## 2015-09-18 NOTE — Progress Notes (Signed)
Female visitor came to nursing station to request ginger ale for pt  ED RN staff informed

## 2015-09-18 NOTE — ED Provider Notes (Signed)
CSN: [id number]     Arrival date & time 09/18/15  1402 History   First MD Initiated Contact with Patient 09/18/15 1424     No chief complaint on file.    The history is provided by the patient and medical records.   Patient presents to the emergency department complaining of mild shortness of breath over the past 24-48 hours.  She does have a history of COPD.  She's had mild cough as well.  She reports no fever or chills.  She denies chest pain.  No abdominal pain nausea vomiting or diarrhea.   Past Medical History  Diagnosis Date  . GERD (gastroesophageal reflux disease)   . Bronchiectasis   . COPD (chronic obstructive pulmonary disease) (HCC)   . Osteoporosis   . Breast calcifications     right breast  . Arthritis   . Breast calcifications, right 06/27/2012    Surgically excised on 17Oct13    Past Surgical History  Procedure Laterality Date  . Tonsillectomy    . Fracture surgery      rt foot  . Eye surgery      rt cataract  . Colonoscopy    . Breast biopsy  07/21/2012    Procedure: BREAST BIOPSY WITH NEEDLE LOCALIZATION;  Surgeon: Currie Parishristian J Streck, MD;  Location: [facility];  Service: General;  Laterality: Right;   Family History  Problem Relation Age of Onset  . Heart disease Father   . Cancer Father     colon  . Heart disease Brother   . Heart disease Sister   . Cancer Sister     colon  . Breast cancer Mother   . Cancer Mother     breast   Social History  Substance Use Topics  . Smoking status: Former Smoker -- 25 years    Types: Cigarettes    Quit date: 10/05/1990  . Smokeless tobacco: Never Used  . Alcohol Use: 1.2 oz/week    2 Cans of beer per week   OB History    No data available     Review of Systems  All other systems reviewed and are negative.     Allergies  Prednisone and Stiolto respimat  Home Medications   Prior to Admission medications   Medication Sig Start Date End Date Taking? Authorizing Provider  aspirin EC  81 MG tablet Take 81 mg by mouth daily.   Yes Historical Provider, MD  Biotin 1000 MCG tablet Take 1,000 mcg by mouth daily.    Yes Historical Provider, MD  budesonide-formoterol (SYMBICORT) 160-4.5 MCG/ACT inhaler Inhale 2 puffs into the lungs 2 (two) times daily. 10/22/14  Yes Leslye Peerobert S Byrum, MD  Calcium Carbonate-Vitamin D (CALCIUM + D) 600-200 MG-UNIT TABS Take 1 tablet by mouth daily.    Yes Historical Provider, MD  clarithromycin (BIAXIN) 500 MG tablet 2 tablets by mouth three times weekly 04/26/15  Yes Leslye Peerobert S Byrum, MD  ethambutol (MYAMBUTOL) 400 MG tablet Take 2 and 1/2 tabs by mouth three times weekly 04/26/15  Yes Leslye Peerobert S Byrum, MD  omeprazole (PRILOSEC) 20 MG capsule Take 20 mg by mouth daily.     Yes Historical Provider, MD  PROAIR HFA 108 (90 BASE) MCG/ACT inhaler 2 PUFFS EVERY 4 HOURS AS NEEDED FOR SHORTNESS OF BREATH 10/29/14  Yes Leslye Peerobert S Byrum, MD  raloxifene (EVISTA) 60 MG tablet Take 60 mg by mouth daily.   Yes Historical Provider, MD   BP 138/75 mmHg  Pulse 103  Temp(Src) 98  F (36.7 C) (Oral)  Resp 18  SpO2 93% Physical Exam  Constitutional: She is oriented to person, place, and time. She appears well-developed and well-nourished. No distress.  HENT:  Head: Normocephalic and atraumatic.  Eyes: EOM are normal.  Neck: Normal range of motion.  Cardiovascular: Normal rate, regular rhythm and normal heart sounds.   Pulmonary/Chest: Effort normal. She has wheezes.  Abdominal: Soft. She exhibits no distension. There is no tenderness.  Musculoskeletal: Normal range of motion.  Neurological: She is alert and oriented to person, place, and time.  Skin: Skin is warm and dry.  Psychiatric: She has a normal mood and affect. Judgment normal.  Nursing note and vitals reviewed.   ED Course  Procedures (including critical care time) Labs Review Labs Reviewed  CBC WITH DIFFERENTIAL/PLATELET - Abnormal; Notable for the following:    WBC 12.0 (*)    Neutro Abs 11.2 (*)     Lymphs Abs 0.6 (*)    All other components within normal limits  COMPREHENSIVE METABOLIC PANEL - Abnormal; Notable for the following:    Glucose, Bld 140 (*)    All other components within normal limits  BRAIN NATRIURETIC PEPTIDE - Abnormal; Notable for the following:    B Natriuretic Peptide 169.3 (*)    All other components within normal limits  TROPONIN I    Imaging Review No results found. I have personally reviewed and evaluated these images and lab results as part of my medical decision-making.   EKG Interpretation   Date/Time:  Wednesday September 18 2015 14:11:04 EST Ventricular Rate:  100 PR Interval:  129 QRS Duration: 100 QT Interval:  341 QTC Calculation: 440 R Axis:   -36 Text Interpretation:  Sinus tachycardia Probable left atrial enlargement  Incomplete RBBB and LAFB RSR' in V1 or V2, right VCD or RVH No significant  change was found Confirmed by Brodie Scovell  MD, Caryn Bee ([id number]) on 09/18/2015  4:23:03 PM      MDM   Final diagnoses:  COPD exacerbation (HCC)    4:21 PM Patient feels better this time.  Discharge home in good condition.  Patient be referred back to her pulmonary team at [facility] pulmonology.  Patient be placed on a short steroid burst.    Azalia Bilis, MD 09/21/15 [phone]

## 2015-09-19 ENCOUNTER — Telehealth: Payer: Self-pay | Admitting: Emergency Medicine

## 2015-09-19 NOTE — Telephone Encounter (Signed)
Spoke with pt. She wanted to make us aware that she went to the ED yesterday for SOB. EMS was called to transport her. Was having SOB. States today that she is feeling much better. An OV has been scheduled for her routine follow up with [facility] per her request. Nothing further was needed.

## 2015-10-14 ENCOUNTER — Other Ambulatory Visit: Payer: Self-pay | Admitting: Emergency Medicine

## 2015-11-28 ENCOUNTER — Ambulatory Visit: Payer: Medicare Other | Admitting: Emergency Medicine

## 2015-12-03 ENCOUNTER — Other Ambulatory Visit: Payer: Self-pay | Admitting: Emergency Medicine

## 2016-01-03 ENCOUNTER — Ambulatory Visit (INDEPENDENT_AMBULATORY_CARE_PROVIDER_SITE_OTHER): Payer: Medicare Other | Admitting: Emergency Medicine

## 2016-01-03 ENCOUNTER — Encounter: Payer: Self-pay | Admitting: Emergency Medicine

## 2016-01-03 VITALS — BP 130/82 | HR 99 | Ht 64.0 in | Wt 92.0 lb

## 2016-01-03 DIAGNOSIS — A310 Pulmonary mycobacterial infection: Secondary | ICD-10-CM

## 2016-01-03 DIAGNOSIS — J479 Bronchiectasis, uncomplicated: Secondary | ICD-10-CM | POA: Diagnosis not present

## 2016-01-03 NOTE — Assessment & Plan Note (Signed)
Please continue your Symbicort twice a day We will reorder your albuterol inhaler since pro-Air is non-formulary.  Please continue your antibiotics as directed by [facility] Please find out the timing of any repeat CT scans of your chest planned at [facility] so that Dr Byrum can have copies of this.  Follow with Dr Byrum in 4 months or sooner if you have any problems.   

## 2016-01-03 NOTE — Progress Notes (Signed)
Subjective:   Patient ID: Karen Costa, female    DOB: 07/28/1930, 82 y.o.   MRN: [id number]  HPI Karen Costa is a 82 year old woman who follows for COPD and bronchiectasis.  She also has a history of a right sided pulmonary nodule. her last CT Scan, 11/08, showed stable RLL scar, also seen on f/u CT scan 6/12. . No longer uses spiriva or advair. Last seen 6/13. She remains very active, no breathing limitations. She uses albuterol prn, about once a week. Cough under good control, allergies under good control. She is interested in Oralair > a sublingual allergy med, against 5 of th most common grasses.   ROV 05/10/14 -- hx COPD and bronchiectasis, allergies, nodular disease. She has been seen twice since our last visit for AE, last treated with levaquin x 5 days. She was primarily having dyspnea, but now also with some yellow thick mucous.   ROV 06/20/14 --  COPD and bronchiectasis, allergies, nodular disease. She has been having much more trouble over the last several months - more dyspnea, more purulent mucus. She was admitted early September for an exacerbation. She has micronodular disease that has been ascribed to possible chronic mycobacterial colonization (no cx data).  She is much improved since hospital. She has not needed to use her albuterol. She has mucous production but it is clear. She is finishing prednisone taper now, cefdinir.   ROV 09/18/14 -- follow up visit for hx bronchiectasis, suspected MAIC, COPD. She saw Dr Shelle Iron in 11/15 with an exacerbation. Sputum AFB at that time is negative so far.  She remains SOB, with a cough, productive. She was treated with medrol, cipro. She is on rotating abx monthly. She on symbicort, using albuterol more often, once a day.    ROV 10/03/14 -- follows for hx bronchiectasis, suspected MAIC, COPD. She had sputum done that is AFB+ >> . We changed symbicort to stiolto to see if she would prefer.  She had more side effects, jitters on the stiolto.    ROV 11/23/14 -- follow up visit for bronchiectasis and MAIC, COPD.  She had nausea, diarrhea and possible blood in her stool on clarithro, ethambutol,  rifabutin so we stopped these. The restarted in series to see how she tolerates. She is currently on clarithro + ethambutol, not yet on rifabutin. Tolerating well thus far, no diarrhea. She has [facility] ID at [facility]. She is using proair 1-2x a day. She remains on symbicort   ROV 02/25/15 -- follow-up visit for COPD and mycobacterial disease. She has been followed in the infectious disease clinic at [facility]. Most recently they have recommended changing the rifabutin to rifampin, staying on the clarithro and ethambutol.  She has been on abx for 4 months. They are collecting sputua at [facility]. She is on Symbicort. A little more cough last few weeks, clear sputum. She relates this to an increase in allergy exposure. She uses albuterol only rarely.             ROV 06/06/15 --  This is a follow-up visit for history of bronchiectasis in the setting of mycobacterial disease, also obstructive lung disease and allergic rhinitis. She has been on therapy for Mycobacterium avium as directed by the infectious diseases department at [facility]. She is to see them next week, has been on rx x 8 months. Her allergies and cough are not very active right now, less discolored sputum. Her breathing is stable. Some occasional wheeze.  She remains on Symbicort.  ROV 01/03/16 --  follow-up visit for history of bronchiectasis and mycobacterial disease. [facility], [facility]. She is unsure when her next CT chest is planned. She had an acute flare in late Dec '16, was treated with pred burst. She is on symbicort bid. She needs to change ProAir to an alternative for [employer]. She gets winded with house work, coughs every day.      Objective:   Physical Exam Filed Vitals:   01/03/16 1543  BP: 130/82  Pulse: 99   Height: 5\' 4"  (1.626 m)  Weight: 92 lb (41.731 kg)  SpO2: 91%   Gen: Pleasant, thin, in no distress,  normal affect  ENT: No lesions,  mouth clear,  oropharynx clear, no postnasal drip  Neck: No JVD, no TMG, no carotid bruits  Lungs: no wheeze or rhonchi, improved today compared with last visit  Cardiovascular: RRR, heart sounds normal, no murmur or gallops, no peripheral edema  Musculoskeletal: No deformities, no cyanosis or clubbing  Neuro: alert, non focal  Skin: Warm, no lesions or rashes    CT chest 06/12/14 --  COMPARISON: Chest x-ray 06/10/2014. Chest CT 03/13/2011.  FINDINGS: Right apical and peripheral right upper lobe nodular densities are noted, stable since prior CT compatible with scarring. Interstitial prominence and clustered micro and macro nodularity noted in the right upper lobe, also stable since 2012 compatible with sequela of old infection/inflammation. Similar peripheral nodular densities in the right lower lobe, stable.  New cavitary area in the right lower lobe measuring 18 mm. Overlying pleural thickening. New nodular area along the right diaphragm in the right lower lobe on image 50 measuring 17 mm.  Peripheral nodular densities in the left lower lobe have increased since 2012, most likely infectious/ inflammatory. 8 mm peripheral nodule on image 39 in the left lower lobe is stable.  No pleural effusions.  Areas of bronchiectasis noted in the right upper lobe and right lower lobe, stable. No mediastinal, hilar, or axillary adenopathy. Chest wall soft tissues are unremarkable. Heart is normal size. Scattered aortic and coronary artery calcifications. Imaging into the upper abdomen shows no acute findings. No acute bony abnormality.  IMPRESSION: Right apical densities are stable since 2012 compatible with scarring. Peripheral interstitial thickening and nodularity in the right upper lobe and right lower lobe are stable since  2012. Increasing peripheral clustered nodules in the left lower lobe, likely infectious/inflammatory. 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  New cavitary area in the right lower lobe measuring 17 mm. This is not demonstrate suspicious nodularity and is also most likely related to chronic mycobacterium infection.  New nodular area lung the right hemidiaphragm measuring 17 mm. Favor postinflammatory as well. This could be followed with repeat CT in 6 months.    Assessment & Plan:  MAIC (mycobacterium avium-intracellulare complex) Please continue your Symbicort twice a day We will reorder your albuterol inhaler since pro-Air is non-formulary.  Please [facility] your [facility] Please find out the timing of any repeat [facility] scans of [facility] so that Dr Delton CoombesByrum can have copies of this.  Follow with Dr Delton CoombesByrum in 4 months or sooner if you have any problems.  BRONCHIECTASIS WITHOUT ACUTE EXACERBATION Please continue your Symbicort twice a day We will reorder your albuterol inhaler since pro-Air is non-formulary.

## 2016-01-03 NOTE — Assessment & Plan Note (Signed)
Please continue your Symbicort twice a day We will reorder your albuterol inhaler since pro-Air is non-formulary.

## 2016-01-03 NOTE — Patient Instructions (Addendum)
Please continue your Symbicort twice a day We will reorder your albuterol inhaler since pro-Air is non-formulary.  Please [facility] Please find out the timing of any repeat CT [facility] of your [facility] so that Dr Delton CoombesByrum can have copies of this.  Follow with Dr Delton CoombesByrum in 4 months or sooner if you have any problems.

## 2016-01-08 ENCOUNTER — Other Ambulatory Visit: Payer: Self-pay | Admitting: Emergency Medicine

## 2016-01-10 ENCOUNTER — Telehealth: Payer: Self-pay | Admitting: Emergency Medicine

## 2016-01-10 NOTE — Telephone Encounter (Signed)
Called and spoke to pt. Pt c/o dizziness that started several weeks ago. Pt states she is contributing the dizziness to the Symbicort that she has been taking for several years. Pt states she called to let us know she will stop the Symbicort for a few days then re-assess to see if the dizziness has improved. Advised pt to call the office back early next week for an update and if anything worsens over the weekend to then go to ED for eval. Pt verbalized understanding and denied any further questions or concerns at this time. Will keep message open to check on pt next week.   Will send to RB as FYI.

## 2016-01-13 NOTE — Telephone Encounter (Signed)
Thank you :)

## 2016-01-14 ENCOUNTER — Telehealth: Payer: Self-pay | Admitting: Emergency Medicine

## 2016-01-14 NOTE — Telephone Encounter (Signed)
atc pt, line rang several times, then rang to fast busy signal.  Wcb.

## 2016-01-16 NOTE — Telephone Encounter (Signed)
Patient returned call, may be reached at [phone]

## 2016-01-16 NOTE — Telephone Encounter (Signed)
Pt states that she feels much better since last seen and since we last spoke with her. Pt wanted to let Dr Delton CoombesByrum know and wish him a Happy Easter.  Nothing further needed.

## 2016-01-16 NOTE — Telephone Encounter (Signed)
lmtcb for pt.  

## 2016-01-20 NOTE — Telephone Encounter (Signed)
Thank you :)

## 2016-02-20 ENCOUNTER — Other Ambulatory Visit: Payer: Self-pay | Admitting: *Deleted

## 2016-02-20 MED ORDER — ALBUTEROL SULFATE HFA 108 (90 BASE) MCG/ACT IN AERS: 2.0000 | INHALATION_SPRAY | Freq: Four times a day (QID) | RESPIRATORY_TRACT | Status: DC | PRN

## 2016-02-26 ENCOUNTER — Emergency Department (HOSPITAL_COMMUNITY): Payer: Medicare Other

## 2016-02-26 ENCOUNTER — Emergency Department (HOSPITAL_COMMUNITY)
Admission: EM | Admit: 2016-02-26 | Discharge: 2016-02-26 | Disposition: A | Discharge status: Home / Self Care | Payer: Medicare Other | Attending: Emergency Medicine | Admitting: Emergency Medicine

## 2016-02-26 ENCOUNTER — Encounter (HOSPITAL_COMMUNITY): Payer: Self-pay | Admitting: Emergency Medicine

## 2016-02-26 DIAGNOSIS — Z87891 Personal history of nicotine dependence: Secondary | ICD-10-CM | POA: Insufficient documentation

## 2016-02-26 DIAGNOSIS — Z7982 Long term (current) use of aspirin: Secondary | ICD-10-CM | POA: Insufficient documentation

## 2016-02-26 DIAGNOSIS — Z79899 Other long term (current) drug therapy: Secondary | ICD-10-CM | POA: Diagnosis not present

## 2016-02-26 DIAGNOSIS — R0602 Shortness of breath: Secondary | ICD-10-CM | POA: Diagnosis present

## 2016-02-26 DIAGNOSIS — Z792 Long term (current) use of antibiotics: Secondary | ICD-10-CM | POA: Insufficient documentation

## 2016-02-26 DIAGNOSIS — J441 Chronic obstructive pulmonary disease with (acute) exacerbation: Secondary | ICD-10-CM | POA: Diagnosis not present

## 2016-02-26 LAB — CBC WITH DIFFERENTIAL/PLATELET
BASOS ABS: 0 10*3/uL (ref 0.0–0.1)
Basophils Relative: 0 %
EOS ABS: 0 10*3/uL (ref 0.0–0.7)
EOS PCT: 0 %
HCT: 42.1 % (ref 36.0–46.0)
Hemoglobin: 14 g/dL (ref 12.0–15.0)
Lymphocytes Relative: 5 %
Lymphs Abs: 0.6 10*3/uL — ABNORMAL LOW (ref 0.7–4.0)
MCH: 31 pg (ref 26.0–34.0)
MCHC: 33.3 g/dL (ref 30.0–36.0)
MCV: 93.1 fL (ref 78.0–100.0)
Monocytes Absolute: 0.1 10*3/uL (ref 0.1–1.0)
Monocytes Relative: 1 %
Neutro Abs: 10.6 10*3/uL — ABNORMAL HIGH (ref 1.7–7.7)
Neutrophils Relative %: 94 %
PLATELETS: 268 10*3/uL (ref 150–400)
RBC: 4.52 MIL/uL (ref 3.87–5.11)
RDW: 13.9 % (ref 11.5–15.5)
WBC: 11.3 10*3/uL — AB (ref 4.0–10.5)

## 2016-02-26 LAB — BASIC METABOLIC PANEL
Anion gap: 10 (ref 5–15)
BUN: 11 mg/dL (ref 6–20)
CO2: 23 mmol/L (ref 22–32)
CREATININE: 0.56 mg/dL (ref 0.44–1.00)
Calcium: 9.5 mg/dL (ref 8.9–10.3)
Chloride: 104 mmol/L (ref 101–111)
GFR calc Af Amer: >60 mL/min (ref >60)
Glucose, Bld: 155 mg/dL — ABNORMAL HIGH (ref 65–99)
Potassium: 4.1 mmol/L (ref 3.5–5.1)
SODIUM: 137 mmol/L (ref 135–145)

## 2016-02-26 MED ORDER — IPRATROPIUM-ALBUTEROL 0.5-2.5 (3) MG/3ML IN SOLN
3.0000 mL | RESPIRATORY_TRACT | Status: DC
  Administered 2016-02-26: 3 mL via RESPIRATORY_TRACT
  Filled 2016-02-26: qty 3

## 2016-02-26 MED ORDER — DEXAMETHASONE 4 MG PO TABS: 4.0000 mg | ORAL_TABLET | Freq: Every day | ORAL | Status: DC

## 2016-02-26 MED ORDER — PREDNISONE 20 MG PO TABS: 40.0000 mg | ORAL_TABLET | Freq: Every day | ORAL | Status: DC

## 2016-02-26 NOTE — Discharge Instructions (Signed)
Please take your prednisone as prescribed to help with your COPD exacerbation. Please follow-up with your pulmonologist, Dr. Delton CoombesByrum in the next couple of days for reevaluation. Return to ED for any new or worsening symptoms as we discussed.  Chronic Obstructive Pulmonary Disease Exacerbation Chronic obstructive pulmonary disease (COPD) is a common lung condition in which airflow from the lungs is limited. COPD is a general term that can be used to describe many different lung problems that limit airflow, including chronic bronchitis and emphysema. COPD exacerbations are episodes when breathing symptoms become much worse and require extra treatment. Without treatment, COPD exacerbations can be life threatening, and frequent COPD exacerbations can cause further damage to your lungs. CAUSES  Respiratory infections.  Exposure to smoke.  Exposure to air pollution, chemical fumes, or dust. Sometimes there is no apparent cause or trigger. RISK FACTORS  Smoking cigarettes.  Older age.  Frequent prior COPD exacerbations. SIGNS AND SYMPTOMS  Increased coughing.  Increased thick spit (sputum) production.  Increased wheezing.  Increased shortness of breath.  Rapid breathing.  Chest tightness. DIAGNOSIS Your medical history, a physical exam, and tests will help your health care provider make a diagnosis. Tests may include:  A chest X-ray.  Basic lab tests.  Sputum testing.  An arterial blood gas test. TREATMENT Depending on the severity of your COPD exacerbation, you may need to be admitted to a hospital for treatment. Some of the treatments commonly used to treat COPD exacerbations are:   Antibiotic medicines.  Bronchodilators. These are drugs that expand the air passages. They may be given with an inhaler or nebulizer. Spacer devices may be needed to help improve drug delivery.  Corticosteroid medicines.  Supplemental oxygen therapy.  Airway clearing techniques, such as  noninvasive ventilation (NIV) and positive expiratory pressure (PEP). These provide respiratory support through a mask or other noninvasive device. HOME CARE INSTRUCTIONS  Do not smoke. Quitting smoking is very important to prevent COPD from getting worse and exacerbations from happening as often.  Avoid exposure to all substances that irritate the airway, especially to tobacco smoke.  If you were prescribed an antibiotic medicine, finish it all even if you start to feel better.  Take all medicines as directed by your health care provider.It is important to use correct technique with inhaled medicines.  Drink enough fluids to keep your urine clear or pale yellow (unless you have a medical condition that requires fluid restriction).  Use a cool mist vaporizer. This makes it easier to clear your chest when you cough.  If you have a home nebulizer and oxygen, continue to use them as directed.  Maintain all necessary vaccinations to prevent infections.  Exercise regularly.  Eat a healthy diet.  Keep all follow-up appointments as directed by your health care provider. SEEK IMMEDIATE MEDICAL CARE IF:  You have worsening shortness of breath.  You have trouble talking.  You have severe chest pain.  You have blood in your sputum.  You have a fever.  You have weakness, vomit repeatedly, or faint.  You feel confused.  You continue to get worse. MAKE SURE YOU:  Understand these instructions.  Will watch your condition.  Will get help right away if you are not doing well or get worse.   This information is not intended to replace advice given to you by your health care provider. Make sure you discuss any questions you have with your health care provider.   Document Released: 07/19/2007 Document Revised: 10/12/2014 Document Reviewed: 05/26/2013 Elsevier Interactive  Patient Education 2016 [employer].

## 2016-02-26 NOTE — ED Notes (Signed)
Bed: [id number] Expected date:  Expected time:  Means of arrival:  Comments: EMS- 82yo F, COPD/SOB/meds given

## 2016-02-26 NOTE — ED Notes (Addendum)
Per EMS, pt from home with COPD exacerbation and SOB. Received albuterol and solumedrol with EMS. Lungs clear.

## 2016-02-26 NOTE — ED Provider Notes (Signed)
CSN: [id number]     Arrival date & time 02/26/16  1023 History   First MD Initiated Contact with Patient 02/26/16 1101     Chief Complaint  Patient presents with  . Shortness of Breath     (Consider location/radiation/quality/duration/timing/severity/associated sxs/prior Treatment) HPI Karen Costa is a 82 y.o. female with history of COPD, bronchiectasis, followed by [facility] here for evaluation of shortness of breath and COPD exacerbation. Patient reports her the past couple of days she has noticed an increase in her work of breathing, not relieved by her albuterol and Symbicort inhaler.  [place] and [place] is very high and hasn't been cut yet. Suspects this is a source of her symptoms. She also reports a productive cough, which is baseline for her. She denies any fevers, chills, chest pain, leg swelling, orthopnea or PND. No nausea, vomiting, diaphoresis, abdominal or back pain. No other modifying factors. Per EMS, patient received Solu-Medrol and breathing treatment en route.  Past Medical History  Diagnosis Date  . GERD (gastroesophageal reflux disease)   . Bronchiectasis   . COPD (chronic obstructive pulmonary disease) (HCC)   . Osteoporosis   . Breast calcifications     right breast  . Arthritis   . Breast calcifications, right 06/27/2012    Surgically excised on 17Oct13    Past Surgical History  Procedure Laterality Date  . Tonsillectomy    . Fracture surgery      rt foot  . Eye surgery      rt cataract  . Colonoscopy    . Breast biopsy  07/21/2012    Procedure: BREAST BIOPSY WITH NEEDLE LOCALIZATION;  Surgeon: Currie Parishristian J Streck, MD;  Location: [facility];  Service: General;  Laterality: Right;   Family History  Problem Relation Age of Onset  . Heart disease Father   . Cancer Father     colon  . Heart disease Brother   . Heart disease Sister   . Cancer Sister     colon  . Breast cancer Mother   . Cancer Mother      breast   Social History  Substance Use Topics  . Smoking status: Former Smoker -- 25 years    Types: Cigarettes    Quit date: 10/05/1990  . Smokeless tobacco: Never Used  . Alcohol Use: 1.2 oz/week    2 Cans of beer per week   OB History    No data available     Review of Systems  A 10 point review of systems was completed and was negative except for pertinent positives and negatives as mentioned in the history of present illness    Allergies  Prednisone and Stiolto respimat  Home Medications   Prior to Admission medications   Medication Sig Start Date End Date Taking? Authorizing Provider  albuterol (PROVENTIL HFA;VENTOLIN HFA) 108 (90 Base) MCG/ACT inhaler Inhale 2 puffs into the lungs every 6 (six) hours as needed for wheezing or shortness of breath. 02/20/16  Yes Leslye Peerobert S Byrum, MD  aspirin EC 81 MG tablet Take 81 mg by mouth daily.   Yes Historical Provider, MD  Biotin 1000 MCG tablet Take 1,000 mcg by mouth daily.    Yes Historical Provider, MD  budesonide-formoterol (SYMBICORT) 160-4.5 MCG/ACT inhaler Inhale 2 puffs into the lungs 2 (two) times daily. 10/22/14  Yes Leslye Peerobert S Byrum, MD  Calcium Carbonate-Vitamin D (CALCIUM + D) 600-200 MG-UNIT TABS Take 1 tablet by mouth daily.  Yes Historical Provider, MD  clarithromycin (BIAXIN) 500 MG tablet TAKE 2 TABLET THREE TIMES A WEEK 12/03/15  Yes Leslye Peer, MD  ethambutol (MYAMBUTOL) 400 MG tablet TAKE 2 & 1/2 TABLETS THREE TIMES WEEKLY 01/09/16  Yes Leslye Peer, MD  omeprazole (PRILOSEC) 20 MG capsule Take 20 mg by mouth daily.     Yes Historical Provider, MD  raloxifene (EVISTA) 60 MG tablet Take 60 mg by mouth daily.   Yes Historical Provider, MD  dexamethasone (DECADRON) 4 MG tablet Take 1 tablet (4 mg total) by mouth daily. 02/26/16   Joycie Peek, PA-C  SYMBICORT 160-4.5 MCG/ACT inhaler USE 2 PUFFS TWICE A DAY Patient not taking: Reported on 02/26/2016 10/14/15   Leslye Peer, MD   BP 128/82 mmHg  Pulse 109   Temp(Src) 98.1 F (36.7 C) (Oral)  Resp 25  SpO2 92% Physical Exam  Constitutional: She is oriented to person, place, and time. She appears well-developed and well-nourished.  Overall well-appearing Caucasian female  HENT:  Head: Normocephalic and atraumatic.  Mouth/Throat: Oropharynx is clear and moist.  Eyes: Conjunctivae are normal. Pupils are equal, round, and reactive to light. Right eye exhibits no discharge. Left eye exhibits no discharge. No scleral icterus.  Neck: Neck supple. No JVD present.  Cardiovascular: Normal rate, regular rhythm and normal heart sounds.   Pulmonary/Chest: Effort normal. No respiratory distress. She has no rales.  Diffuse expiratory wheezes. No respiratory distress.  Abdominal: Soft. There is no tenderness.  Musculoskeletal: Normal range of motion. She exhibits no edema or tenderness.  Neurological: She is alert and oriented to person, place, and time.  Cranial Nerves II-XII grossly intact  Skin: Skin is warm and dry. No rash noted.  Psychiatric: She has a normal mood and affect.  Nursing note and vitals reviewed.   ED Course  Procedures (including critical care time) Labs Review Labs Reviewed  BASIC METABOLIC PANEL - Abnormal; Notable for the following:    Glucose, Bld 155 (*)    All other components within normal limits  CBC WITH DIFFERENTIAL/PLATELET - Abnormal; Notable for the following:    WBC 11.3 (*)    Neutro Abs 10.6 (*)    Lymphs Abs 0.6 (*)    All other components within normal limits    Imaging Review Dg Chest 2 View  02/26/2016  CLINICAL DATA:  82 year old female with increasing shortness of breath since yesterday. Initial encounter. COPD. EXAM: CHEST  2 VIEW COMPARISON:  09/18/2015 and earlier. FINDINGS: Semi upright AP and lateral views of the chest. Chronic hyperinflation. Stable lung volumes. Chronic architectural distortion at the level of the the right hilum appear stable. No superimposed pneumothorax, pulmonary edema or  definite pleural effusion. Stable cardiac size and mediastinal contours. No definite acute pulmonary opacity. Right upper lobe and bibasilar bronchiectasis as seen by CT in 2015. No acute osseous abnormality identified. Calcified aortic atherosclerosis. IMPRESSION: Severe chronic lung disease with no superimposed acute findings identified. Electronically Signed   By: Odessa Fleming M.D.   On: 02/26/2016 12:16   I have personally reviewed and evaluated these images and lab results as part of my medical decision-making.   EKG Interpretation None      MDM  Karen Costa is a 82 y.o. female here for COPD exacerbation. No chest pain. States she feels much better after breathing treatments. Given 125 mg IV Medrol by EMS en route. On arrival, she is afebrile and hemodynamically stable. She doesn't mild tachycardia, likely secondary to albuterol. Screening  labs are unremarkable. Chest x-ray shows chronic changes with no acute abnormalities. Patient states she feels much better and wants to go home. States she is breathing at her baseline. Discussed with my attending, Dr. Patria Mane, who also saw and evaluated the patient. Feels this is reasonable as patient has good support system at home, good PCP and pulmonology follow-up. Will DC with short course steroid burst. Strict return precautions discussed. Final diagnoses:  COPD exacerbation [facility])        Joycie Peek, PA-C 02/26/16 1455  Azalia Bilis, MD 02/26/16 1651

## 2016-03-11 ENCOUNTER — Telehealth: Payer: Self-pay | Admitting: Emergency Medicine

## 2016-03-11 NOTE — Telephone Encounter (Signed)
Spoke with pt. She is aware of RB's recommendation. Nothing further was needed at this time.

## 2016-03-11 NOTE — Telephone Encounter (Signed)
Spoke with the pt  She states that Biaxin was causing diarrhea and "just making me feel awful" so she was advised by [facility] ID to stop it on 03/05/16  They started her on Zithro 500 mg daily and had to stop it after only a few doses due to the same symptoms  She has not advised her ID doc that she stopped this, but will call them today  She reports her sputum has been clear and most recent sputum cx was neg  RB can see all results and recs in Care Everywhere  She is asking if RB thinks it would be okay to come off of the Ethambutol and also if we can prescribe her dexamethasone to have on hand if needed She is not in any acute distress today and feels well  She is aware he is out of the office this wk  Please advise thanks!

## 2016-03-11 NOTE — Telephone Encounter (Signed)
I do believe it would be reasonable to stop the ethambutol (in addition to azithro and clarithro) at least for a while to let her GI sx clear. Have her go ahead and stop the abx. I would like for her to let ID know about this and make sure they agree. Not clear why she needs dexamethasone. I would prefer for her to be in touch with me if she develops any sx that are consistent with an acute exacerbation of her bronchiectasis. Then we can determine if she needs steroids or other meds.

## 2016-04-03 ENCOUNTER — Other Ambulatory Visit: Payer: Self-pay | Admitting: Emergency Medicine

## 2016-04-06 ENCOUNTER — Telehealth: Payer: Self-pay | Admitting: Emergency Medicine

## 2016-04-06 DIAGNOSIS — J479 Bronchiectasis, uncomplicated: Secondary | ICD-10-CM

## 2016-04-06 NOTE — Telephone Encounter (Signed)
Called spoke with pt. She is wanting to get a CT scan done on 04/13/16 over at [facility].  She reports [facility] wants RB to order the CT scan. Please advise thanks

## 2016-04-08 NOTE — Telephone Encounter (Signed)
RB is on vacation this week. He will return on 04/13/16. This message will be addressed then.

## 2016-04-10 NOTE — Telephone Encounter (Signed)
Follow up to her call about scheduling a ct scan.  Please call her about it.

## 2016-04-10 NOTE — Telephone Encounter (Signed)
Patient would like to get CT scan done 7/10.  Advised her that Dr. Delton CoombesByrum will not be back in town until 7/10.  Patient said that she can wait until he comes back and schedule CT for 7/17 instead, she is requesting Mid-morning appointment.    Dr. Delton CoombesByrum, please advise.

## 2016-04-13 NOTE — Telephone Encounter (Signed)
Please order CT chest, no contrast, high res cuts. To evaluate bronchiectasis and [facility]

## 2016-04-13 NOTE — Telephone Encounter (Signed)
Called spoke with pt. Reviewed RB's recs. Pt voiced understanding and had no further questions. CT order placed. Nothing further needed.

## 2016-04-20 ENCOUNTER — Telehealth: Payer: Self-pay | Admitting: Cardiology

## 2016-04-20 ENCOUNTER — Inpatient Hospital Stay: Admission: RE | Admit: 2016-04-20 | Payer: Medicare Other | Source: Ambulatory Visit

## 2016-04-20 ENCOUNTER — Ambulatory Visit
Admission: RE | Admit: 2016-04-20 | Discharge: 2016-04-20 | Disposition: A | Discharge status: Home / Self Care | Payer: Medicare Other | Source: Ambulatory Visit | Attending: Emergency Medicine | Admitting: Emergency Medicine

## 2016-04-20 DIAGNOSIS — J479 Bronchiectasis, uncomplicated: Secondary | ICD-10-CM

## 2016-04-20 NOTE — Telephone Encounter (Signed)
New message   Pt c/o Shortness Of Breath: STAT if SOB developed within the last 24 hours or pt is noticeably SOB on the phone  1. Are you currently SOB (can you hear that pt is SOB on the phone)? yes  2. How long have you been experiencing SOB? For along time   3. Are you SOB when sitting or when up moving around?  Yes, right foot is swollen  4. Are you currently experiencing any other symptoms? Right foot is swollen and the lower part of that leg and discomfort in left shoulder

## 2016-04-20 NOTE — Telephone Encounter (Signed)
I spoke with Mrs. Karen Costa.   Her SOB is not new and not different.  She has COPD and bronchiectasis. She follows with Dr. Delton CoombesByrum for this.  She reported left shoulder pain for the last several MONTHS which she describes as waking her at night sometimes and when she holds her shoulder with her hand the pain is relieved.  I suggested this is likely musculoskeletal.    Her newest/biggest concern is her right foot and lower leg swelling for the last week.  It is better in the morning than evening.  Her son recently moved with them and does all the cooking and uses A LOT of salt.  She is going to cut back on salt intake and see if she notices a difference in the swelling.  At the end of the call she told me her husband sees Dr. Antoine [place]Hochrein in [place] and that she saw him many years ago.    I advised to call back if swelling continues or worsens or if her other symptoms worsen.  Advised that she will need to reestablish cardiology care.  She is appreciative for the information provided.

## 2016-04-21 ENCOUNTER — Other Ambulatory Visit: Payer: Medicare Other

## 2016-04-29 ENCOUNTER — Ambulatory Visit (INDEPENDENT_AMBULATORY_CARE_PROVIDER_SITE_OTHER): Payer: Medicare Other | Admitting: Emergency Medicine

## 2016-04-29 ENCOUNTER — Encounter: Payer: Self-pay | Admitting: Emergency Medicine

## 2016-04-29 ENCOUNTER — Ambulatory Visit (INDEPENDENT_AMBULATORY_CARE_PROVIDER_SITE_OTHER)
Admission: RE | Admit: 2016-04-29 | Discharge: 2016-04-29 | Disposition: A | Discharge status: Home / Self Care | Payer: Medicare Other | Source: Ambulatory Visit | Attending: Emergency Medicine | Admitting: Emergency Medicine

## 2016-04-29 VITALS — BP 136/90 | HR 110 | Ht 63.0 in | Wt 91.0 lb

## 2016-04-29 DIAGNOSIS — A310 Pulmonary mycobacterial infection: Secondary | ICD-10-CM

## 2016-04-29 DIAGNOSIS — J939 Pneumothorax, unspecified: Secondary | ICD-10-CM | POA: Diagnosis not present

## 2016-04-29 DIAGNOSIS — J449 Chronic obstructive pulmonary disease, unspecified: Secondary | ICD-10-CM

## 2016-04-29 DIAGNOSIS — J479 Bronchiectasis, uncomplicated: Secondary | ICD-10-CM | POA: Diagnosis not present

## 2016-04-29 MED ORDER — METHYLPREDNISOLONE 4 MG PO TBPK: ORAL_TABLET | ORAL | 0 refills | Status: DC

## 2016-04-29 NOTE — Assessment & Plan Note (Signed)
Some wheezing noted on exam. Increased dyspnea. We will treat with a Medrol Dosepak and follow

## 2016-04-29 NOTE — Assessment & Plan Note (Signed)
Most recent culture is negative after 42 days. She is off antibiotics at this time. Her CT scan of the chest shows persistent micronodular and bronchiectatic disease with a right lower lobe cavity. Unclear that this is active disease versus sequela of her mycobacterial disease. Hold off on antibodies for now and follow her clinically and radiographically

## 2016-04-29 NOTE — Progress Notes (Signed)
Subjective:   Patient ID: Karen Costa, female    DOB: 07/28/1930, 82 y.o.   MRN: [id number]  HPI Karen Costa is a 82 year old woman who follows for COPD and bronchiectasis.  She also has a history of a right sided pulmonary nodule. her last CT Scan, 11/08, showed stable RLL scar, also seen on f/u CT scan 6/12. . No longer uses spiriva or advair. Last seen 6/13. She remains very active, no breathing limitations. She uses albuterol prn, about once a week. Cough under good control, allergies under good control. She is interested in Oralair > a sublingual allergy med, against 5 of th most common grasses.   ROV 05/10/14 -- hx COPD and bronchiectasis, allergies, nodular disease. She has been seen twice since our last visit for AE, last treated with levaquin x 5 days. She was primarily having dyspnea, but now also with some yellow thick mucous.   ROV 06/20/14 --  COPD and bronchiectasis, allergies, nodular disease. She has been having much more trouble over the last several months - more dyspnea, more purulent mucus. She was admitted early September for an exacerbation. She has micronodular disease that has been ascribed to possible chronic mycobacterial colonization (no cx data).  She is much improved since hospital. She has not needed to use her albuterol. She has mucous production but it is clear. She is finishing prednisone taper now, cefdinir.   ROV 09/18/14 -- follow up visit for hx bronchiectasis, suspected MAIC, COPD. She saw Dr Shelle Iron in 11/15 with an exacerbation. Sputum AFB at that time is negative so far.  She remains SOB, with a cough, productive. She was treated with medrol, cipro. She is on rotating abx monthly. She on symbicort, using albuterol more often, once a day.    ROV 10/03/14 -- follows for hx bronchiectasis, suspected MAIC, COPD. She had sputum done that is AFB+ >> . We changed symbicort to stiolto to see if she would prefer.  She had more side effects, jitters on the stiolto.    ROV 11/23/14 -- follow up visit for bronchiectasis and MAIC, COPD.  She had nausea, diarrhea and possible blood in her stool on clarithro, ethambutol,  rifabutin so we stopped these. The restarted in series to see how she tolerates. She is currently on clarithro + ethambutol, not yet on rifabutin. Tolerating well thus far, no diarrhea. She has seen ID at [facility]. She is using proair 1-2x a day. She remains on symbicort   ROV 02/25/15 -- follow-up visit for COPD and mycobacterial disease. She has been followed in the infectious disease clinic at [facility]. Most recently they have recommended changing the rifabutin to rifampin, staying on the clarithro and ethambutol.  She has been on abx for 4 months. They are collecting sputua at [facility]. She is on Symbicort. A little more cough last few weeks, clear sputum. She relates this to an increase in allergy exposure. She uses albuterol only rarely.             ROV 06/06/15 --  This is a follow-up visit for history of bronchiectasis in the setting of mycobacterial disease, also obstructive lung disease and allergic rhinitis. She has been on therapy for Mycobacterium avium as directed by the infectious diseases department at [facility]. She is to see them next week, has been on rx x 8 months. Her allergies and cough are not very active right now, less discolored sputum. Her breathing is stable. Some occasional wheeze.  She remains on Symbicort.  ROV 01/03/16 --  follow-up visit for history of bronchiectasis and mycobacterial disease. She is still on abx for [facility], has f/u arranged at [facility] in mid-May with a sputum sample. She is unsure when her next CT chest is planned. She had an acute flare in late Dec '16, was treated with pred burst. She is on symbicort bid. She needs to change ProAir to an alternative for [employer]. She gets winded with house work, coughs every day.   ROV 04/29/16 -- Patient with a history of bronchiectasis and MAIC. She underwent a CT scan of the  chest on 04/20/16 that I personally reviewed. This showed diffuse parabronchial vascular nodularity and bronchial thickening with some scattered consolidation that slightly progressed compared with 06/2014. Also of note was a trace right pneumothorax. She is currnetly off abx, stopped a couple months ago.  She is more SOB over last 2-3 months.  Her cough is minimal.       Objective:   Physical Exam Vitals:   04/29/16 1402  BP: 136/90  Pulse: (!) 110  SpO2: 92%  Weight: 91 lb (41.3 kg)  Height: 5\' 3"  (1.6 m)   Gen: Pleasant, thin, in no distress,  normal affect  ENT: No lesions,  mouth clear,  oropharynx clear, no postnasal drip  Neck: No JVD, no TMG, no carotid bruits  Lungs: no wheeze or rhonchi, improved today compared with last visit  Cardiovascular: RRR, heart sounds normal, no murmur or gallops, no peripheral edema  Musculoskeletal: No deformities, no cyanosis or clubbing  Neuro: alert, non focal  Skin: Warm, no lesions or rashes    CT chest 06/12/14 --  COMPARISON: Chest x-ray 06/10/2014. Chest CT 03/13/2011.  FINDINGS: Right apical and peripheral right upper lobe nodular densities are noted, stable since prior CT compatible with scarring. Interstitial prominence and clustered micro and macro nodularity noted in the right upper lobe, also stable since 2012 compatible with sequela of old infection/inflammation. Similar peripheral nodular densities in the right lower lobe, stable.  New cavitary area in the right lower lobe measuring 18 mm. Overlying pleural thickening. New nodular area along the right diaphragm in the right lower lobe on image 50 measuring 17 mm.  Peripheral nodular densities in the left lower lobe have increased since 2012, most likely infectious/ inflammatory. 8 mm peripheral nodule on image 39 in the left lower lobe is stable.  No pleural effusions.  Areas of bronchiectasis noted in the right upper lobe and right lower lobe, stable. No  mediastinal, hilar, or axillary adenopathy. Chest wall soft tissues are unremarkable. Heart is normal size. Scattered aortic and coronary artery calcifications. Imaging into the upper abdomen shows no acute findings. No acute bony abnormality.  IMPRESSION: Right apical densities are stable since 2012 compatible with scarring. Peripheral interstitial thickening and nodularity in the right upper lobe and right lower lobe are stable since 2012. Increasing peripheral clustered nodules in the left lower lobe, likely infectious/inflammatory. 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  New cavitary area in the right lower lobe measuring 17 mm. This is not demonstrate suspicious nodularity and is also most likely related to chronic mycobacterium infection.  New nodular area lung the right hemidiaphragm measuring 17 mm. Favor postinflammatory as well. This could be followed with repeat CT in 6 months.    Assessment & Plan:  BRONCHIECTASIS WITHOUT ACUTE EXACERBATION Some wheezing noted on exam. Increased dyspnea. We will treat with a Medrol Dosepak and follow  COPD (chronic obstructive pulmonary disease) (HCC) Continue current Symbicort and albuterol  when necessary  MAIC (mycobacterium avium-intracellulare complex) Most recent culture is negative after 42 days. She is off antibiotics at this time. Her CT scan of the chest shows persistent micronodular and bronchiectatic disease with a right lower lobe cavity. Unclear that this is active disease versus sequela of her mycobacterial disease. Hold off on antibodies for now and follow her clinically and radiographically  Pneumothorax on right Very small pneumothorax on the right. Question whether she may have had a bleb that ruptured. Good air movement on exam. I will follow her with serial chest x-rays. I discussed with her the need to seek immediate care if she has sudden dyspnea or sudden chest pain.  Levy Pupa, MD, PhD 04/29/2016, 2:54 PM [facility] [phone] or if no answer [phone]

## 2016-04-29 NOTE — Assessment & Plan Note (Signed)
Very small pneumothorax on the right. Question whether she may have had a bleb that ruptured. Good air movement on exam. I will follow her with serial chest x-rays. I discussed with her the need to seek immediate care if she has sudden dyspnea or sudden chest pain.

## 2016-04-29 NOTE — Assessment & Plan Note (Signed)
Continue current Symbicort and albuterol when necessary

## 2016-04-29 NOTE — Patient Instructions (Addendum)
Please continue your Symbicort as you are taking it Take albuterol 2 puffs up to every 4 hours if needed for shortness of breath.  Please take medrol dose pack until completely gone.  CXR today.  Follow with Dr Delton Coombes in 2 months or sooner if you have any problems.

## 2016-04-30 ENCOUNTER — Telehealth: Payer: Self-pay | Admitting: Emergency Medicine

## 2016-04-30 NOTE — Telephone Encounter (Signed)
Try reducing the medrol to 1 tablet daily through the weekend. See if that is better tolerated. Please call back for Dr Delton Coombes Monday and let him know how she is doing.

## 2016-04-30 NOTE — Telephone Encounter (Signed)
Pt feels that she is having a reaction to her medication Medrol dosepak (4mg ) - took 2 tabs this morning around 8am and it caused fast heart rate. Pt states that it took about 20-53mins for her HR to slow down. Pt states that she feels better now and is thinking that her body may need to get used to the medication? I advised that after a few doses her body could very well become more tolerant to the steroid but that I would check with one of our MD's for rec's Pt aware that RB is off this afternoon but Dr Maple Hudson is answering any urgent messages for him.  Pt is okay with this.  Please advise Dr Maple Hudson. Thanks.   Allergies  Allergen Reactions  . Prednisone     Pt stated that this medication made her feel terrible.  Has patient had a PCN reaction causing immediate rash, facial/tongue/throat swelling, SOB or lightheadedness with hypotension: No Has patient had a PCN reaction causing severe rash involving mucus membranes or skin necrosis: No Has patient had a PCN reaction that required hospitalization No Has patient had a PCN reaction occurring within the last 10 years: Yes If all of the above answers are "NO", then may proceed with Cephalosporin use.  . Stiolto Respimat [Tiotropium Bromide-Olodaterol] Palpitations    Dizziness      Medication List       Accurate as of 04/30/16  1:03 PM. Always use your most recent med list.          albuterol 108 (90 Base) MCG/ACT inhaler Commonly known as:  PROVENTIL HFA;VENTOLIN HFA Inhale 2 puffs into the lungs every 6 (six) hours as needed for wheezing or shortness of breath.   aspirin EC 81 MG tablet Take 81 mg by mouth daily.   Biotin 1000 MCG tablet Take 1,000 mcg by mouth daily.   budesonide-formoterol 160-4.5 MCG/ACT inhaler Commonly known as:  SYMBICORT Inhale 2 puffs into the lungs 2 (two) times daily.   Calcium Carbonate-Vitamin D 600-200 MG-UNIT Tabs Take 1 tablet by mouth daily.   clarithromycin 500 MG tablet Commonly known as:   BIAXIN TAKE 2 TABLET THREE TIMES A WEEK   ethambutol 400 MG tablet Commonly known as:  MYAMBUTOL TAKE 2 & 1/2 TABLETS THREE TIMES WEEKLY   methylPREDNISolone 4 MG Tbpk tablet Commonly known as:  MEDROL DOSEPAK Take as directed per packet instructions   omeprazole 20 MG capsule Commonly known as:  PRILOSEC Take 20 mg by mouth daily.   raloxifene 60 MG tablet Commonly known as:  EVISTA Take 60 mg by mouth daily.

## 2016-04-30 NOTE — Telephone Encounter (Signed)
Pt calling back again.Karen Costa ° °

## 2016-04-30 NOTE — Telephone Encounter (Signed)
Spoke with patient, discussed CY's recommendations as stated below Pt okay with these recommendations and voiced her understanding Will sign and forward to RB as FYI

## 2016-04-30 NOTE — Telephone Encounter (Signed)
Spoke with pt. She states that someone already called her and gave her these instructions. Nothing further was needed at this time.

## 2016-05-04 ENCOUNTER — Telehealth: Payer: Self-pay | Admitting: Emergency Medicine

## 2016-05-04 NOTE — Telephone Encounter (Signed)
Complete medrol dosepak

## 2016-05-04 NOTE — Telephone Encounter (Signed)
Spoke with pt. She is still having issues with taking medrol. Pt was told to call back and let us know how she is doing today. Reports SOB and feeling uneasy. Denies chest tightness, wheezing, coughing. Would like to know if she should continue taking Medrol daily or stop.  RA - please advise as RB is working night shift. Thanks.

## 2016-05-04 NOTE — Telephone Encounter (Signed)
Spoke with pt. She is aware of RA's recommendation. Nothing further was needed. 

## 2016-05-26 ENCOUNTER — Other Ambulatory Visit: Payer: Self-pay | Admitting: Internal Medicine

## 2016-05-26 DIAGNOSIS — R600 Localized edema: Secondary | ICD-10-CM

## 2016-06-04 ENCOUNTER — Ambulatory Visit (HOSPITAL_COMMUNITY): Payer: Medicare Other | Attending: Cardiology

## 2016-06-04 ENCOUNTER — Other Ambulatory Visit: Payer: Self-pay

## 2016-06-04 ENCOUNTER — Encounter (INDEPENDENT_AMBULATORY_CARE_PROVIDER_SITE_OTHER): Payer: Self-pay

## 2016-06-04 ENCOUNTER — Encounter (HOSPITAL_COMMUNITY): Payer: Self-pay | Admitting: *Deleted

## 2016-06-04 DIAGNOSIS — Z87891 Personal history of nicotine dependence: Secondary | ICD-10-CM | POA: Insufficient documentation

## 2016-06-04 DIAGNOSIS — R600 Localized edema: Secondary | ICD-10-CM

## 2016-06-04 DIAGNOSIS — R911 Solitary pulmonary nodule: Secondary | ICD-10-CM | POA: Insufficient documentation

## 2016-06-04 DIAGNOSIS — I340 Nonrheumatic mitral (valve) insufficiency: Secondary | ICD-10-CM | POA: Insufficient documentation

## 2016-06-04 DIAGNOSIS — J449 Chronic obstructive pulmonary disease, unspecified: Secondary | ICD-10-CM | POA: Insufficient documentation

## 2016-06-04 DIAGNOSIS — J939 Pneumothorax, unspecified: Secondary | ICD-10-CM | POA: Diagnosis not present

## 2016-06-04 DIAGNOSIS — Z8249 Family history of ischemic heart disease and other diseases of the circulatory system: Secondary | ICD-10-CM | POA: Insufficient documentation

## 2016-06-04 NOTE — Progress Notes (Unsigned)
Technically Difficult Images due to small body habitus, narrow rib spacing, lung interference, and breathing difficulty.  Patient is not able to lay flat or on left side or cooperate with breathing maneuvers due to already being extremely short of breath.  Echo was performed without contrast due to limited availability of windows (contrast will not improve these images).    Karen Costa, RDCS

## 2016-07-02 ENCOUNTER — Ambulatory Visit (INDEPENDENT_AMBULATORY_CARE_PROVIDER_SITE_OTHER): Payer: Medicare Other | Admitting: Emergency Medicine

## 2016-07-02 ENCOUNTER — Telehealth: Payer: Self-pay

## 2016-07-02 ENCOUNTER — Encounter: Payer: Self-pay | Admitting: Emergency Medicine

## 2016-07-02 ENCOUNTER — Ambulatory Visit (INDEPENDENT_AMBULATORY_CARE_PROVIDER_SITE_OTHER)
Admission: RE | Admit: 2016-07-02 | Discharge: 2016-07-02 | Disposition: A | Discharge status: Home / Self Care | Payer: Medicare Other | Source: Ambulatory Visit | Attending: Emergency Medicine | Admitting: Emergency Medicine

## 2016-07-02 VITALS — BP 134/80 | HR 106 | Wt 93.0 lb

## 2016-07-02 DIAGNOSIS — Z23 Encounter for immunization: Secondary | ICD-10-CM | POA: Diagnosis not present

## 2016-07-02 DIAGNOSIS — J449 Chronic obstructive pulmonary disease, unspecified: Secondary | ICD-10-CM

## 2016-07-02 MED ORDER — TIOTROPIUM BROMIDE-OLODATEROL 2.5-2.5 MCG/ACT IN AERS: 2.0000 | INHALATION_SPRAY | Freq: Every day | RESPIRATORY_TRACT | 5 refills | Status: DC

## 2016-07-02 MED ORDER — TIOTROPIUM BROMIDE MONOHYDRATE 1.25 MCG/ACT IN AERS: 2.0000 | INHALATION_SPRAY | Freq: Every day | RESPIRATORY_TRACT | 6 refills | Status: DC

## 2016-07-02 MED ORDER — TIOTROPIUM BROMIDE-OLODATEROL 2.5-2.5 MCG/ACT IN AERS: 2.0000 | INHALATION_SPRAY | Freq: Every day | RESPIRATORY_TRACT | 0 refills | Status: DC

## 2016-07-02 NOTE — Patient Instructions (Addendum)
It is nice to meet you today. Flu shot today We will do a CXR today. We will call you with results. Walk on room air today. Increase Flonase to 2 puff each nare daily. Continue your Symbicort daily as you have been doing. Continue your Proventil as needed for shortness of breath or wheezing. Follow up with Dr. Delton CoombesByrum in 1 month Please contact office for sooner follow up if symptoms do not improve or worsen or seek emergency care

## 2016-07-02 NOTE — Progress Notes (Signed)
Patient ID: Karen FishRosalind F Gwynn, female   DOB: 30-Aug-1930, 82 y.o.   MRN: [id number] Patient seen in the office today and instructed on use of stiolto respimat.  Patient expressed understanding and demonstrated technique.

## 2016-07-02 NOTE — Telephone Encounter (Signed)
Patient was given sample of sitolto at her appointment, per RB patient should do spiriva respimat 2 puffs daily, I have d/c the sitolto and have made both patient's husband and pharmacy aware. Pt husband is aware to tell patient to not use the sitolto and spiriva has been called in. Nothing further needed.

## 2016-07-02 NOTE — Progress Notes (Signed)
History of Present Illness Karen Costa is a 82 y.o. female with COPD and Bronchiectasis/ MAIC followed by Dr. Delton Coombes   Karen Costa is a 82 year old woman who follows for COPD and bronchiectasis.  She also has a history of a right sided pulmonary nodule. her last CT Scan, 11/08, showed stable RLL scar, also seen on f/u CT scan 6/12. . No longer uses spiriva or advair. Last seen 6/13. She remains very active, no breathing limitations. She uses albuterol prn, about once a week. Cough under good control, allergies under good control. She is interested in Oralair > a sublingual allergy med, against 5 of th most common grasses.   07/02/2016 Follow Up Appointment: Pt. Presents to the office today for follow up of COPD, Bronchiectasis and MAIC. She states she has continued to feel more short of breath with exertion.. She has noted that she is using her Pro Air rescue inhaler more frequently. She has had to use it a total of 17 times in September compared to twice in June and 9 times each in July and August. She has been using Flonase 1 spray once daily per nostril.She is compliant with Symbicort daily.  Tests:  04/10/2016: CT Chest IMPRESSION:  1. Trace right pneumothorax. Critical Value/emergent results were called by telephone at the time of interpretation on 04/20/2016 at 9:56 am to Dr. Levy Pupa , who verbally acknowledged these results. 2. Pulmonary parenchymal pattern of fairly diffuse peribronchovascular nodularity, peribronchial thickening, scattered mucoid impaction, scattered consolidation and mild architectural distortion, slightly progressive from 06/12/2014 and most indicative of mycobacterium avium complex.   Echo>> 06/04/2016 Technically difficult study with poor acoustic windows. Normal LV   size with EF 60-65%. RV poorly visualized. No significant   valvular abnormalities noted. Grade 1 Diastolic Dysfunction  Past medical hx Past Medical History:  Diagnosis Date  .  Arthritis   . Breast calcifications    right breast  . Breast calcifications, right 06/27/2012   Surgically excised on 17Oct13   . Bronchiectasis   . COPD (chronic obstructive pulmonary disease) (HCC)   . GERD (gastroesophageal reflux disease)   . Osteoporosis      Past surgical hx, Family hx, Social hx all reviewed.  Current Outpatient Prescriptions on File Prior to Visit  Medication Sig  . albuterol (PROVENTIL HFA;VENTOLIN HFA) 108 (90 Base) MCG/ACT inhaler Inhale 2 puffs into the lungs every 6 (six) hours as needed for wheezing or shortness of breath.  Marland Kitchen aspirin EC 81 MG tablet Take 81 mg by mouth daily.  . Biotin 1000 MCG tablet Take 1,000 mcg by mouth daily.   . budesonide-formoterol (SYMBICORT) 160-4.5 MCG/ACT inhaler Inhale 2 puffs into the lungs 2 (two) times daily.  . Calcium Carbonate-Vitamin D (CALCIUM + D) 600-200 MG-UNIT TABS Take 1 tablet by mouth daily.   Marland Kitchen omeprazole (PRILOSEC) 20 MG capsule Take 20 mg by mouth daily.    . raloxifene (EVISTA) 60 MG tablet Take 60 mg by mouth daily.   No current facility-administered medications on file prior to visit.      Allergies  Allergen Reactions  . Prednisone     Pt stated that this medication made her feel terrible.  Has patient had a PCN reaction causing immediate rash, facial/tongue/throat swelling, SOB or lightheadedness with hypotension: No Has patient had a PCN reaction causing severe rash involving mucus membranes or skin necrosis: No Has patient had a PCN reaction that required hospitalization No Has patient had a PCN reaction occurring within the last  10 years: Yes If all of the above answers are "NO", then may proceed with Cephalosporin use.  . Stiolto Respimat [Tiotropium Bromide-Olodaterol] Palpitations    Dizziness     Review Of Systems:  Constitutional:   No  weight loss, night sweats,  Fevers, chills, fatigue, or  lassitude.  HEENT:   No headaches,  Difficulty swallowing,  Tooth/dental problems, or   Sore throat,                No sneezing, itching, ear ache, nasal congestion, post nasal drip,   CV:  No chest pain,  Orthopnea, PND, swelling in lower extremities, anasarca, dizziness, palpitations, syncope.   GI  No heartburn, indigestion, abdominal pain, nausea, vomiting, diarrhea, change in bowel habits, loss of appetite, bloody stools.   Resp: No shortness of breath with exertion or at rest.  No excess mucus, no productive cough,  No non-productive cough,  No coughing up of blood.  No change in color of mucus.  No wheezing.  No chest wall deformity  Skin: no rash or lesions.  GU: no dysuria, change in color of urine, no urgency or frequency.  No flank pain, no hematuria   MS:  No joint pain or swelling.  No decreased range of motion.  No back pain.  Psych:  No change in mood or affect. No depression or anxiety.  No memory loss.   Vital Signs BP 134/80 (BP Location: Left Arm, Cuff Size: Normal)   Pulse (!) 106   Wt 93 lb (42.2 kg)   SpO2 92%   BMI 16.47 kg/m    Physical Exam:  General- No distress,  A&Ox3, pleasant ENT: No sinus tenderness, TM clear, pale nasal mucosa, no oral exudate,no post nasal drip, no LAN Cardiac: S1, S2, regular rate and rhythm, no murmur Chest: No wheeze/ rales/ dullness; diminished per bases bilaterally,no accessory muscle use, no nasal flaring, no sternal retractions Abd.: Soft Non-tender Ext: No clubbing cyanosis, trace edema Neuro:  normal strength Skin: No rashes, warm and dry Psych: normal mood and behavior   Assessment/Plan  COPD (chronic obstructive pulmonary disease) (HCC) Continued dyspnea with exertion: Flu shot today We will do a CXR today. We will call you with results. Walk on room air today. Increase Flonase to 2 puff each nare daily. Continue your Symbicort daily as you have been doing. Continue your Proventil as needed for shortness of breath or wheezing. Follow up with Dr. Delton CoombesByrum in 1 month Please contact office for  sooner follow up if symptoms do not improve or worsen or seek emergency care      Bevelyn NgoSarah F Shuna Tabor, NP 07/02/2016  3:32 PM   Attending Note:  I have examined patient, reviewed labs, studies and notes. I have discussed the case with Saralyn PilarS Caylin Nass, and I agree with the data and plans as amended above. Pt well known to me with a hx COPD and bronchiectasis. She has experienced worsening exertional dyspnea in absence of any clear change in her cough or sputum production. She is on Symbicort. Walking oximetry today showed desaturation to 89% after 2 laps. She stopped for dyspnea. She needs a CXR to insure no increase in size of her R ptx. Also we will try adding Spiriva to her existing Symbicort to see if she benefits.   Levy Pupaobert Byrum, MD, PhD 07/02/2016, 4:27 PM [facility] [phone] or if no answer [phone]

## 2016-07-02 NOTE — Assessment & Plan Note (Signed)
Continued dyspnea with exertion: Flu shot today We will do a CXR today. We will call you with results. Walk on room air today. Increase Flonase to 2 puff each nare daily. Continue your Symbicort daily as you have been doing. Continue your Proventil as needed for shortness of breath or wheezing. Follow up with Dr. Delton CoombesByrum in 1 month Please contact office for sooner follow up if symptoms do not improve or worsen or seek emergency care

## 2016-07-02 NOTE — Telephone Encounter (Signed)
Spoke with patient and made her aware of previous message. Pt aware and voiced understanding. Nothing further needed.

## 2016-07-30 ENCOUNTER — Telehealth: Payer: Self-pay | Admitting: Emergency Medicine

## 2016-07-30 NOTE — Telephone Encounter (Signed)
Called and spoke to pt. Pt questioning if there is anything that can be done for her SOB, this is unchanged from last OV on 9.28.17. Pt originally had an appt with RB on 08/10/16 for a 1 month ROV, appt change to 08/03/16. Pt verbalized understanding and denied any further questions or concerns at this time.

## 2016-08-03 ENCOUNTER — Ambulatory Visit (INDEPENDENT_AMBULATORY_CARE_PROVIDER_SITE_OTHER): Payer: Medicare Other | Admitting: Emergency Medicine

## 2016-08-03 ENCOUNTER — Encounter: Payer: Self-pay | Admitting: Emergency Medicine

## 2016-08-03 VITALS — BP 120/64 | HR 112 | Ht 63.0 in | Wt 94.0 lb

## 2016-08-03 DIAGNOSIS — J9383 Other pneumothorax: Secondary | ICD-10-CM

## 2016-08-03 DIAGNOSIS — J449 Chronic obstructive pulmonary disease, unspecified: Secondary | ICD-10-CM | POA: Diagnosis not present

## 2016-08-03 DIAGNOSIS — J939 Pneumothorax, unspecified: Secondary | ICD-10-CM

## 2016-08-03 DIAGNOSIS — A310 Pulmonary mycobacterial infection: Secondary | ICD-10-CM | POA: Diagnosis not present

## 2016-08-03 DIAGNOSIS — J479 Bronchiectasis, uncomplicated: Secondary | ICD-10-CM | POA: Diagnosis not present

## 2016-08-03 NOTE — Assessment & Plan Note (Signed)
eval as above. Not following at [facility] currently.

## 2016-08-03 NOTE — Progress Notes (Signed)
History of Present Illness Karen Costa is a 82 y.o. female with COPD and Bronchiectasis/ MAIC followed by Dr. Delton CoombesByrum   Karen Costa is a 82 year old woman who follows for COPD and bronchiectasis.  She also has a history of a right sided pulmonary nodule. her last CT Scan, 11/08, showed stable RLL scar, also seen on f/u CT scan 6/12. . No longer uses spiriva or advair. Last seen 6/13. She remains very active, no breathing limitations. She uses albuterol prn, about once a week. Cough under good control, allergies under good control. She is interested in Oralair > a sublingual allergy med, against 5 of th most common grasses.   07/02/16 --  Pt. Presents to the office today for follow up of COPD, Bronchiectasis and MAIC. She states she has continued to feel more short of breath with exertion.. She has noted that she is using her Pro Air rescue inhaler more frequently. She has had to use it a total of 17 times in September compared to twice in June and 9 times each in July and August. She has been using Flonase 1 spray once daily per nostril.She is compliant with Symbicort daily.  08/03/16 -- this follow-up visit for patient with a history of COPD and bronchiectasis, history of right-sided pulmonary nodule has been stable on serial CT evaluations. At her last visit we tried adding Spiriva to Symbicort to see if she would benefit.  She feels more dyspneic this visit compared to last time. No CP, minimal cough. Reports good sleep.    Tests:  04/10/2016: CT Chest IMPRESSION:  1. Trace right pneumothorax. Critical Value/emergent results were called by telephone at the time of interpretation on 04/20/2016 at 9:56 am to Dr. Levy PupaOBERT Morayma [place] , who verbally acknowledged these results. 2. Pulmonary parenchymal pattern of fairly diffuse peribronchovascular nodularity, peribronchial thickening, scattered mucoid impaction, scattered consolidation and mild architectural distortion, slightly progressive from  06/12/2014 and most indicative of mycobacterium avium complex.   Echo>> 06/04/2016 Technically difficult study with poor acoustic windows. Normal LV   size with EF 60-65%. RV poorly visualized. No significant   valvular abnormalities noted. Grade 1 Diastolic Dysfunction  Past medical hx Past Medical History:  Diagnosis Date  . Arthritis   . Breast calcifications    right breast  . Breast calcifications, right 06/27/2012   Surgically excised on 17Oct13   . Bronchiectasis   . COPD (chronic obstructive pulmonary disease) (HCC)   . GERD (gastroesophageal reflux disease)   . Osteoporosis      Past surgical hx, Family hx, Social hx all reviewed.  Current Outpatient Prescriptions on File Prior to Visit  Medication Sig  . albuterol (PROVENTIL HFA;VENTOLIN HFA) 108 (90 Base) MCG/ACT inhaler Inhale 2 puffs into the lungs every 6 (six) hours as needed for wheezing or shortness of breath.  Marland Kitchen. aspirin EC 81 MG tablet Take 81 mg by mouth daily.  . Biotin 1000 MCG tablet Take 1,000 mcg by mouth daily.   . budesonide-formoterol (SYMBICORT) 160-4.5 MCG/ACT inhaler Inhale 2 puffs into the lungs 2 (two) times daily.  . Calcium Carbonate-Vitamin D (CALCIUM + D) 600-200 MG-UNIT TABS Take 1 tablet by mouth daily.   . furosemide (LASIX) 20 MG tablet Take 20 mg by mouth daily.  Marland Kitchen. omeprazole (PRILOSEC) 20 MG capsule Take 20 mg by mouth daily.    . raloxifene (EVISTA) 60 MG tablet Take 60 mg by mouth daily.  . Tiotropium Bromide Monohydrate (SPIRIVA RESPIMAT) 2.5 MCG/ACT AERS Inhale 2 puffs into the  lungs daily.   No current facility-administered medications on file prior to visit.      Allergies  Allergen Reactions  . Prednisone     Pt stated that this medication made her feel terrible.  Has patient had a PCN reaction causing immediate rash, facial/tongue/throat swelling, SOB or lightheadedness with hypotension: No Has patient had a PCN reaction causing severe rash involving mucus membranes or skin  necrosis: No Has patient had a PCN reaction that required hospitalization No Has patient had a PCN reaction occurring within the last 10 years: Yes If all of the above answers are "NO", then may proceed with Cephalosporin use.  . Stiolto Respimat [Tiotropium Bromide-Olodaterol] Palpitations    Dizziness     Review Of Systems:  Constitutional:   No  weight loss, night sweats,  Fevers, chills, fatigue, or  lassitude.  HEENT:   No headaches,  Difficulty swallowing,  Tooth/dental problems, or  Sore throat,                No sneezing, itching, ear ache, nasal congestion, post nasal drip,   CV:  No chest pain,  Orthopnea, PND, swelling in lower extremities, anasarca, dizziness, palpitations, syncope.   GI  No heartburn, indigestion, abdominal pain, nausea, vomiting, diarrhea, change in bowel habits, loss of appetite, bloody stools.   Resp: No shortness of breath with exertion or at rest.  No excess mucus, no productive cough,  No non-productive cough,  No coughing up of blood.  No change in color of mucus.  No wheezing.  No chest wall deformity  Skin: no rash or lesions.  GU: no dysuria, change in color of urine, no urgency or frequency.  No flank pain, no hematuria   MS:  No joint pain or swelling.  No decreased range of motion.  No back pain.  Psych:  No change in mood or affect. No depression or anxiety.  No memory loss.   Vital Signs BP 120/64 (BP Location: Left Arm, Cuff Size: Normal)   Pulse (!) 112   Ht 5\' 3"  (1.6 m)   Wt 94 lb (42.6 kg)   SpO2 91%   BMI 16.65 kg/m    Physical Exam:  General- No distress,  A&Ox3, pleasant ENT: No sinus tenderness, TM clear, pale nasal mucosa, no oral exudate,no post nasal drip, no LAN Cardiac: S1, S2, regular rate and rhythm, no murmur Chest: No wheeze/ rales/ dullness; diminished per bases bilaterally,no accessory muscle use, no nasal flaring, no sternal retractions Abd.: Soft Non-tender Ext: No clubbing cyanosis, trace edema Neuro:   normal strength Skin: No rashes, warm and dry Psych: normal mood and behavior   Assessment/Plan  BRONCHIECTASIS WITHOUT ACUTE EXACERBATION Progressively worsening dyspnea. ? Worsening MAIC. Her CT scan from July showed progressive nodular disease. Need to repeat her AFB, consider re[peating abx therapy. Will repeat her Ct chest now - also need to look at her PTX  COPD (chronic obstructive pulmonary disease) (HCC) Stop spiriva for now, assess to see if she misses it. Continue symbicport   MAIC (mycobacterium avium-intracellulare complex) eval as above. Not following at [facility] currently.   Pneumothorax on right Repeat Ct to see if this has enlarged vs resolved.    Levy Pupaobert Henok Heacock, MD, PhD 08/03/2016, 12:44 PM [facility] [phone] or if no answer [phone]

## 2016-08-03 NOTE — Assessment & Plan Note (Signed)
Repeat Ct to see if this has enlarged vs resolved.

## 2016-08-03 NOTE — Patient Instructions (Signed)
We will repeat your CT scan of the chest to [facility] and also the pneumothorax.  Continue symbicort Stop spiriva for now. Call us if you miss the medication.  We will perform sputum cultures to look for [facility].  Follow with Dr Delton CoombesByrum next available after your CT scan to review.

## 2016-08-03 NOTE — Assessment & Plan Note (Signed)
Progressively worsening dyspnea. ? Worsening MAIC. Her CT scan from July showed progressive nodular disease. Need to repeat her AFB, consider re[peating abx therapy. Will repeat her Ct chest now - also need to look at her PTX

## 2016-08-03 NOTE — Assessment & Plan Note (Signed)
Stop spiriva for now, assess to see if she misses it. Continue symbicport

## 2016-08-06 ENCOUNTER — Telehealth: Payer: Self-pay | Admitting: Emergency Medicine

## 2016-08-06 NOTE — Telephone Encounter (Signed)
Spoke with the pt  She states RB advised at last ov to try off of spiriva to see if she would miss it  She states that she has noticed a decline in her breathing, so she is going to start this back  She will call if not improving once she starts back  Will forward to RB to make him aware

## 2016-08-07 NOTE — Telephone Encounter (Signed)
Thank you :)

## 2016-08-10 ENCOUNTER — Ambulatory Visit
Admission: RE | Admit: 2016-08-10 | Discharge: 2016-08-10 | Disposition: A | Discharge status: Home / Self Care | Payer: Medicare Other | Source: Ambulatory Visit | Attending: Emergency Medicine | Admitting: Emergency Medicine

## 2016-08-10 ENCOUNTER — Other Ambulatory Visit: Payer: Medicare Other

## 2016-08-10 ENCOUNTER — Ambulatory Visit: Payer: Medicare Other | Admitting: Emergency Medicine

## 2016-08-10 DIAGNOSIS — J479 Bronchiectasis, uncomplicated: Secondary | ICD-10-CM

## 2016-08-13 LAB — RESPIRATORY CULTURE OR RESPIRATORY AND SPUTUM CULTURE
GRAM STAIN: NONE SEEN
ORGANISM ID, BACTERIA: NORMAL

## 2016-08-20 ENCOUNTER — Telehealth: Payer: Self-pay | Admitting: Emergency Medicine

## 2016-08-20 MED ORDER — ALBUTEROL SULFATE HFA 108 (90 BASE) MCG/ACT IN AERS: 2.0000 | INHALATION_SPRAY | Freq: Four times a day (QID) | RESPIRATORY_TRACT | 5 refills | Status: DC | PRN

## 2016-08-20 NOTE — Telephone Encounter (Signed)
Called spoke with patient who reports Spiriva is making her "drunk as a goat" - she is also taking Ventolin and feels that this is compounding it.  She would like to switch back to [employer]air and stop the Spiriva.    Advised pt will be happy to change her Ventolin to Proair and reminded her about insurance not paying.  Pt stated that she will pay for this out of pocket.  [facility]    Per pt's last ov on 10.30.17, RB stated was okay for her to stop the Spiriva.  Pt stated that she was trying to determine if her symptoms were in indeed from the Spiriva and have decided that they are   [facility] and spoke with pharmacist Marcelino DusterMichelle.  Verbal given to change back to Proair and pt will pay for this out of pocket.  Med list updated.  Pt is scheduled to see RB 11.28.17 Will sign and route to RB as FYI  10.30.17 ov w/ RB: Patient Instructions  We will repeat your CT scan of the chest to [facility] your [facility] and also the pneumothorax.  Continue symbicort Stop spiriva for now. Call us if you miss the medication.  We will perform sputum cultures [facility].  Follow with Dr Delton CoombesByrum next available after your CT scan to review.

## 2016-08-25 ENCOUNTER — Ambulatory Visit (INDEPENDENT_AMBULATORY_CARE_PROVIDER_SITE_OTHER): Payer: Medicare Other | Admitting: Emergency Medicine

## 2016-08-25 DIAGNOSIS — J470 Bronchiectasis with acute lower respiratory infection: Secondary | ICD-10-CM

## 2016-08-25 MED ORDER — PREDNISONE 10 MG PO TABS: ORAL_TABLET | ORAL | 0 refills | Status: DC

## 2016-08-25 MED ORDER — LEVOFLOXACIN 250 MG PO TABS: 250.0000 mg | ORAL_TABLET | Freq: Every day | ORAL | 0 refills | Status: DC

## 2016-08-25 MED ORDER — TIOTROPIUM BROMIDE MONOHYDRATE 18 MCG IN CAPS: 18.0000 ug | ORAL_CAPSULE | Freq: Every day | RESPIRATORY_TRACT | 6 refills | Status: DC

## 2016-08-25 NOTE — Progress Notes (Signed)
History of Present Illness Karen Costa is a 82 y.o. female with COPD and Bronchiectasis/ MAIC followed by Dr. Delton CoombesByrum   Karen Costa is a 82 year old woman who follows for COPD and bronchiectasis.  She also has a history of a right sided pulmonary nodule. her last CT Scan, 11/08, showed stable RLL scar, also seen on f/u CT scan 6/12. . No longer uses spiriva or advair. Last seen 6/13. She remains very active, no breathing limitations. She uses albuterol prn, about once a week. Cough under good control, allergies under good control. She is interested in Oralair > a sublingual allergy med, against 5 of th most common grasses.   07/02/16 --  Pt. Presents to the office today for follow up of COPD, Bronchiectasis and MAIC. She states she has continued to feel more short of breath with exertion.. She has noted that she is using her Pro Air rescue inhaler more frequently. She has had to use it a total of 17 times in September compared to twice in June and 9 times each in July and August. She has been using Flonase 1 spray once daily per nostril.She is compliant with Symbicort daily.  08/03/16 -- this follow-up visit for patient with a history of COPD and bronchiectasis, history of right-sided pulmonary nodule has been stable on serial CT evaluations. At her last visit we tried adding Spiriva to Symbicort to see if she would benefit.  She feels more dyspneic this visit compared to last time. No CP, minimal cough. Reports good sleep.   ROV 08/25/16 -- this is a follow-up visit for history of COPD, bronchiectasis, MAIC, and chronic dyspnea with cough. She has been having more trouble since our last visit. CT scan from July showed progressive micronodular disease. Marland Kitchen. Her CT on 11/ 6/17 to look for interval change. This showed extensive bronchiectasis, bronchial wall thickening consistent with her mycobacterial disease. There was mosaic groundglass and left hemithorax. At her last visit we tried stopping Spiriva  to see if she would tolerate. She felt worse off the medication so tried to restart. She developed dizziness / shakiness. At the same time her albuterol had been changed from proair to ventolin. She is having more control of SOB and a bit less dizziness back on proair. . She has am cough, probably more than usual baseline, worst in the am. AFB negative so far from w 2 weeks ago.     04/10/2016: CT Chest IMPRESSION: 1. Trace right pneumothorax. Critical Value/emergent results were called by telephone at the time of interpretation on 04/20/2016 at 9:56 am to Karen Costa , who verbally acknowledged these results. 2. Pulmonary parenchymal pattern of fairly diffuse peribronchovascular nodularity, peribronchial thickening, scattered mucoid impaction, scattered consolidation and mild architectural distortion, slightly progressive from 06/12/2014 and most indicative of mycobacterium avium complex.   Echo>> 06/04/2016 Technically difficult study with poor acoustic windows. Normal LV   size with EF 60-65%. RV poorly visualized. No significant   valvular abnormalities noted. Grade 1 Diastolic Dysfunction   Review Of Systems:  Constitutional:   No  weight loss, night sweats,  Fevers, chills, fatigue, or  lassitude.  HEENT:   No headaches,  Difficulty swallowing,  Tooth/dental problems, or  Sore throat,                No sneezing, itching, ear ache, nasal congestion, post nasal drip,   CV:  No chest pain,  Orthopnea, PND, swelling in lower extremities, anasarca, dizziness, palpitations, syncope.   GI  No heartburn, indigestion, abdominal pain, nausea, vomiting, diarrhea, change in bowel habits, loss of appetite, bloody stools.   Resp: No shortness of breath with exertion or at rest.  No excess mucus, no productive cough,  No non-productive cough,  No coughing up of blood.  No change in color of mucus.  No wheezing.  No chest wall deformity  Skin: no rash or lesions.  GU: no dysuria,  change in color of urine, no urgency or frequency.  No flank pain, no hematuria   MS:  No joint pain or swelling.  No decreased range of motion.  No back pain.  Psych:  No change in mood or affect. No depression or anxiety.  No memory loss.   Vital Signs BP 130/80 (BP Location: Right Arm, Cuff Size: Small)   Pulse 95   SpO2 92%    Physical Exam:  General- No distress,  A&Ox3, pleasant ENT: No sinus tenderness, TM clear, pale nasal mucosa, no oral exudate,no post nasal drip, no LAN Cardiac: S1, S2, regular rate and rhythm, no murmur Chest: No wheeze/ rales/ dullness; diminished per bases bilaterally,no accessory muscle use, no nasal flaring, no sternal retractions Abd.: Soft Non-tender Ext: No clubbing cyanosis, trace edema Neuro:  normal strength Skin: No rashes, warm and dry Psych: normal mood and behavior   Assessment/Plan  BRONCHIECTASIS WITHOUT ACUTE EXACERBATION Please continue your Symbicort twice a day Please restart your Spiriva once a day  Please take prednisone as directed until completely gone.  Take levaquin 250mg  daily until completely gone. Follow with Dr Delton CoombesByrum in 1 month   Karen Pupaobert Byrum, MD, PhD 10/28/2016, 4:15 PM [facility] [phone] or if no answer [phone]

## 2016-08-25 NOTE — Patient Instructions (Addendum)
Please continue your Symbicort twice a day Please restart your Spiriva once a day  Please take prednisone as directed until completely gone.  Take levaquin 250mg  daily until completely gone. Follow with Dr Delton CoombesByrum in 1 month

## 2016-09-09 ENCOUNTER — Telehealth: Payer: Self-pay | Admitting: Emergency Medicine

## 2016-09-09 NOTE — Telephone Encounter (Signed)
  Patient called because of hemoptysis. I called up the patient. She was prescribed levofloxacin and she finished 10 days and she finished that yesterday. She was given that for bronchitis. She started having hemoptysis midway into treatment. No hemoptysis today. She feels better. No fevers. She is not on any blood thinners. I told her we will just keep an eye on her symptoms. Told her to call back if with recurrence.  Rob and LBPU  -- this is just an Financial plannerYI.   Nothing further needed.   Pollie MeyerJ. Angelo A de Dios, MD 09/09/2016, 5:19 PM [facility] Pager [phone] After 3 pm or if no answer, call [phone]

## 2016-09-09 NOTE — Telephone Encounter (Signed)
Spoke with pt. States that she has been coughing up blood. Reports that the blood is streaked in her clear mucus. This started a few days ago. Feels that this is coming from taking Levaquin. They last time she coughed up any blood was yesterday. Pt would like RB's recommendations.  RB - please advise. Thanks.

## 2016-09-09 NOTE — Telephone Encounter (Signed)
Please advise Dr de Dios as Dr Byrum is not available. Thanks.  

## 2016-09-25 ENCOUNTER — Other Ambulatory Visit: Payer: Self-pay | Admitting: Emergency Medicine

## 2016-09-25 LAB — AFB CULTURE WITH SMEAR (NOT AT ARMC)

## 2016-10-08 ENCOUNTER — Ambulatory Visit: Payer: Medicare Other | Admitting: Emergency Medicine

## 2016-10-09 ENCOUNTER — Ambulatory Visit (INDEPENDENT_AMBULATORY_CARE_PROVIDER_SITE_OTHER): Payer: Medicare Other | Admitting: Emergency Medicine

## 2016-10-09 ENCOUNTER — Encounter: Payer: Self-pay | Admitting: Emergency Medicine

## 2016-10-09 DIAGNOSIS — A310 Pulmonary mycobacterial infection: Secondary | ICD-10-CM | POA: Diagnosis not present

## 2016-10-09 DIAGNOSIS — J449 Chronic obstructive pulmonary disease, unspecified: Secondary | ICD-10-CM | POA: Diagnosis not present

## 2016-10-09 DIAGNOSIS — J301 Allergic rhinitis due to pollen: Secondary | ICD-10-CM | POA: Diagnosis not present

## 2016-10-09 NOTE — Progress Notes (Signed)
History of Present Illness Karen Costa is a 83 y.o. female with COPD and Bronchiectasis/ MAIC followed by Dr. Lamonte Sakai   Karen Costa is a 83 year old woman who follows for COPD and bronchiectasis.  She also has a history of a right sided pulmonary nodule. her last CT Scan, 11/08, showed stable RLL scar, also seen on f/u CT scan 6/12. . No longer uses spiriva or advair. Last seen 6/13. She remains very active, no breathing limitations. She uses albuterol prn, about once a week. Cough under good control, allergies under good control. She is interested in Oralair > a sublingual allergy med, against 5 of th most common grasses.   07/02/16 --  Pt. Presents to the office today for follow up of COPD, Bronchiectasis and MAIC. She states she has continued to feel more short of breath with exertion.. She has noted that she is using her Pro Air rescue inhaler more frequently. She has had to use it a total of 17 times in September compared to twice in June and 9 times each in July and August. She has been using Flonase 1 spray once daily per nostril.She is compliant with Symbicort daily.  08/03/16 -- this follow-up visit for patient with a history of COPD and bronchiectasis, history of right-sided pulmonary nodule has been stable on serial CT evaluations. At her last visit we tried adding Spiriva to Symbicort to see if she would benefit.  She feels more dyspneic this visit compared to last time. No CP, minimal cough. Reports good sleep.   ROV 08/25/16 -- this is a follow-up visit for history of COPD, bronchiectasis, MAIC, and chronic dyspnea with cough. She has been having more trouble since our last visit. CT scan from July showed progressive micronodular disease. Marland Kitchen Her CT on 11/ 6/17 to look for interval change. This showed extensive bronchiectasis, bronchial wall thickening consistent with her mycobacterial disease. There was mosaic groundglass and left hemithorax. At her last visit we tried stopping Spiriva  to see if she would tolerate. She felt worse off the medication so tried to restart. She developed dizziness / shakiness. At the same time her albuterol had been changed from proair to ventolin. She is having more control of SOB and a bit less dizziness back on proair. . She has am cough, probably more than usual baseline, worst in the am. AFB negative so far from w 2 weeks ago.   ROV 10/09/16 -- Karen Costa has a history of bronchiectasis in the setting of mycobacterial disease, associated COPD and chronic cough. I treated her for an acute exacerbation of bronchiectasis in late November. We also tried restarting Spiriva in addition to her Symbicort. She has continued to have cough with blood-tinged sputum. Her chest mucous has thinned out compared with last time. She is having more head congestion, clear mucous, has seen some blood in it. She had stopped her flonase for a while, restarted it about 2 weeks ago. .    04/10/2016: CT Chest IMPRESSION: 1. Trace right pneumothorax. Critical Value/emergent results were called by telephone at the time of interpretation on 04/20/2016 at 9:56 am to Dr. Baltazar Apo , who verbally acknowledged these results. 2. Pulmonary parenchymal pattern of fairly diffuse peribronchovascular nodularity, peribronchial thickening, scattered mucoid impaction, scattered consolidation and mild architectural distortion, slightly progressive from 06/12/2014 and most indicative of mycobacterium avium complex.   Echo>> 06/04/2016 Technically difficult study with poor acoustic windows. Normal LV   size with EF 60-65%. RV poorly visualized. No significant  valvular abnormalities noted. Grade 1 Diastolic Dysfunction   Review Of Systems:  Constitutional:   No  weight loss, night sweats,  Fevers, chills, fatigue, or  lassitude.  HEENT:   No headaches,  Difficulty swallowing,  Tooth/dental problems, or  Sore throat,                No sneezing, itching, ear ache, nasal  congestion, post nasal drip,   CV:  No chest pain,  Orthopnea, PND, swelling in lower extremities, anasarca, dizziness, palpitations, syncope.   GI  No heartburn, indigestion, abdominal pain, nausea, vomiting, diarrhea, change in bowel habits, loss of appetite, bloody stools.   Resp: No shortness of breath with exertion or at rest.  No excess mucus, no productive cough,  No non-productive cough,  No coughing up of blood.  No change in color of mucus.  No wheezing.  No chest wall deformity  Skin: no rash or lesions.  GU: no dysuria, change in color of urine, no urgency or frequency.  No flank pain, no hematuria   MS:  No joint pain or swelling.  No decreased range of motion.  No back pain.  Psych:  No change in mood or affect. No depression or anxiety.  No memory loss.   Vital Signs BP 130/90 (BP Location: Right Arm, Patient Position: Sitting, Cuff Size: Normal)   Pulse (!) 109   Ht _0  (1.6 m)   Wt 90 lb (40.8 kg)   SpO2 93%   BMI 15.94 kg/m    Physical Exam:  General- No distress,  A&Ox3, pleasant ENT: No sinus tenderness, TM clear, pale nasal mucosa, no oral exudate,no post nasal drip, no LAN Cardiac: S1, S2, regular rate and rhythm, no murmur Chest: No wheeze/ rales/ dullness; diminished per bases bilaterally,no accessory muscle use, no nasal flaring, no sternal retractions Abd.: Soft Non-tender Ext: No clubbing cyanosis, trace edema Neuro:  normal strength Skin: No rashes, warm and dry Psych: normal mood and behavior   Assessment/Plan  MAIC (mycobacterium avium-intracellulare complex) Most recent respiratory culture was negative for AFB.  COPD (chronic obstructive pulmonary disease) ([place]) She has had the side effect of dizziness in the mornings after she takes her several bronchodilators simultaneously. Use your ProAir 2 puffs first thing in the am and whenever you are having breathing trouble.  Please continue your Symbicort twice a day.  Take your spiriva  about 2 hours after your Symbicort Follow with Dr Lamonte Sakai in 4 months or sooner if you have any problems.  Allergic rhinitis With continued symptoms and now with some mild epistaxis. Continue flonase 2 sprays each nostril daily Use nasal saline spray to keep  your nasal passages moist through the day   Baltazar Apo, MD, PhD 10/09/2016, 11:39 AM [facility] [phone] or if no answer [phone]

## 2016-10-09 NOTE — Assessment & Plan Note (Signed)
Most recent respiratory culture was negative for AFB.

## 2016-10-09 NOTE — Patient Instructions (Addendum)
Use your ProAir 2 puffs first thing in the am and whenever you are having breathing trouble.  Please continue your Symbicort twice a day.  Take your spiriva about 2 hours after your Symbicort Continue flonase 2 sprays each nostril daily Use nasal saline spray to keep  your nasal passages moist through the day Follow with Dr Delton CoombesByrum in 4 months or sooner if you have any problems.

## 2016-10-09 NOTE — Assessment & Plan Note (Signed)
With continued symptoms and now with some mild epistaxis. Continue flonase 2 sprays each nostril daily Use nasal saline spray to keep  your nasal passages moist through the day

## 2016-10-09 NOTE — Assessment & Plan Note (Signed)
She has had the side effect of dizziness in the mornings after she takes her several bronchodilators simultaneously. Use your ProAir 2 puffs first thing in the am and whenever you are having breathing trouble.  Please continue your Symbicort twice a day.  Take your spiriva about 2 hours after your Symbicort Follow with Dr Delton CoombesByrum in 4 months or sooner if you have any problems.

## 2016-10-28 ENCOUNTER — Encounter: Payer: Self-pay | Admitting: Emergency Medicine

## 2016-10-28 NOTE — Assessment & Plan Note (Signed)
Please continue your Symbicort twice a day Please restart your Spiriva once a day  Please take prednisone as directed until completely gone.  Take levaquin 250mg daily until completely gone. Follow with Dr Ahjanae Cassel in 1 month   

## 2017-01-13 ENCOUNTER — Other Ambulatory Visit: Payer: Self-pay | Admitting: Emergency Medicine

## 2017-01-13 ENCOUNTER — Telehealth: Payer: Self-pay | Admitting: Emergency Medicine

## 2017-01-13 NOTE — Telephone Encounter (Signed)
Please advise on refill of Levaquin, I am not sure if this is prevention or maintenance.

## 2017-01-13 NOTE — Telephone Encounter (Signed)
Spoke with the pt  She states that she is wondering about whether or not she should start back on an antibiotic  She states that she is doing well as far as her cough, but still has a lot of SOB  She thinks that the levaquin we gave her back in Nov 2017 really helped her and wants to know if she should start taking a low dose of this on a daily basis  She has appt next month, but wants to know what RB thinks about this in the meantime  Please advise thanks

## 2017-01-15 NOTE — Telephone Encounter (Signed)
Called and spoke with pt and she is aware of RB recs.  Nothing further is needed.  

## 2017-01-15 NOTE — Telephone Encounter (Signed)
Patient calling back - she can be reached at [phone] -pr

## 2017-01-15 NOTE — Telephone Encounter (Signed)
Spoke with pt, aware that we are awaiting RB's recs.    RB please advise on levaquin recs.  Thanks!

## 2017-01-15 NOTE — Telephone Encounter (Signed)
I don't want her on daily levaquin - this will cause her to develop resistance.

## 2017-02-08 ENCOUNTER — Encounter: Payer: Self-pay | Admitting: Emergency Medicine

## 2017-02-08 ENCOUNTER — Ambulatory Visit (INDEPENDENT_AMBULATORY_CARE_PROVIDER_SITE_OTHER): Payer: Medicare Other | Admitting: Emergency Medicine

## 2017-02-08 DIAGNOSIS — J470 Bronchiectasis with acute lower respiratory infection: Secondary | ICD-10-CM | POA: Diagnosis not present

## 2017-02-08 DIAGNOSIS — J301 Allergic rhinitis due to pollen: Secondary | ICD-10-CM | POA: Diagnosis not present

## 2017-02-08 DIAGNOSIS — A310 Pulmonary mycobacterial infection: Secondary | ICD-10-CM

## 2017-02-08 NOTE — Progress Notes (Signed)
History of Present Illness Karen Costa is a 83 y.o. female with COPD and Bronchiectasis/ MAIC followed by Dr. Lamonte Sakai   Karen Costa is a 83 year old woman who follows for COPD and bronchiectasis.  She also has a history of a right sided pulmonary nodule, RLL scar, bronchiectasis in the setting of mycobacterial disease. Has been treated with clarithromycin and ethambutol, rifampin, followed in the past by Infectious Diseases at [facility] 10/09/16 -- Karen Costa has a history of bronchiectasis in the setting of mycobacterial disease, associated COPD and chronic cough. I treated her for an acute exacerbation of bronchiectasis in late November. We also tried restarting Spiriva in addition to her Symbicort. She has continued to have cough with blood-tinged sputum. Her chest mucous has thinned out compared with last time. She is having more head congestion, clear mucous, has seen some blood in it. She had stopped her flonase for a while, restarted it about 2 weeks ago.   ROV 02/08/17 -- this is a follow-up visit for bronchiectasis and history of mycobacterium avium, associated COPD and chronic cough. Other factors include allergic rhinitis. She has been treated in the past with multidrug therapy for her mycobacterial disease, followed at [facility]. She has not been on antibiotics for [facility] since . Most recent CT scan of the chest was in November 2017 as below. Her most recent sputum was 08/10/16, negative for Mycobacterium. She reports that her cough is usually in the morning, sometimes clear to tan mucous. She has only seen blood once in the last visit, very small amount. Her dyspnea is stable, she remains quite limited.    Review Of Systems: As per history of present illness above   Vital Signs BP 124/88 (BP Location: Right Arm, Cuff Size: Normal)   Pulse (!) 120   Ht _0  (1.6 m)   Wt 90 lb (40.8 kg)   SpO2 94%   BMI 15.94 kg/m   Physical Exam:  General- No distress,  A&Ox3, pleasant,  very thin woman ENT: No sinus tenderness, TM clear, pale nasal mucosa, no oral exudate,no post nasal drip, no LAN Cardiac: S1, S2, regular rate and rhythm, no murmur Chest: Distant but clear, no wheezing, no crackles Abd.: Soft Non-tender Ext: No clubbing cyanosis, trace edema Neuro:  normal strength Skin: No rashes, warm and dry Psych: normal mood and behavior    CT chest 08/10/16 --  COMPARISON:  04/20/2016, 06/12/2014.  FINDINGS: Cardiovascular: Atherosclerotic calcification of the arterial vasculature, including coronary arteries. Heart is enlarged and accentuated by a pectus deformity. No pericardial effusion.  Mediastinum/Nodes: Mediastinal lymph nodes are not enlarged by CT size criteria. Hilar regions are difficult to definitively evaluate without IV contrast. No axillary adenopathy. Esophagus is grossly unremarkable.  Lungs/Pleura: Biapical pleural parenchymal scarring, right greater than left. Fairly extensive bronchiectasis, bronchial wall thickening, peribronchovascular nodularity, mucoid impaction and scattered volume loss, progressive from 06/12/2014. Mosaic pulmonary parenchymal attenuation/ground glass on the left, nonspecific. No pleural fluid. Airway is unremarkable.  Upper Abdomen: 10 mm low-attenuation lesion in the right hepatic lobe is unchanged and likely a cyst. Visualized portions of the liver, adrenal glands, right kidney scratch sec kidneys, spleen, pancreas and stomach are grossly unremarkable.  Musculoskeletal: No worrisome lytic or sclerotic lesions. Degenerative changes are seen in the spine.  IMPRESSION: 1. Pulmonary parenchymal pattern of extensive bronchiectasis, bronchial wall thickening, peribronchovascular nodularity, mucoid impaction and scattered volume loss, progressive from 06/12/2014, most suggestive of mycobacterium avium complex (mac). 2. Mosaic ground-glass in the left  hemi thorax is nonspecific. The superimposed acute  infectious/ inflammatory process cannot be excluded. 3. Aortic atherosclerosis (ICD10-170.0). Coronary artery calcification   Assessment/Plan  MAIC (mycobacterium avium-intracellulare complex) No evidence of progression at this time. I like to defer therapy given her recent negative culture results. We will plan to repeat her cultures and CT scan of the chest depending on her clinical status, any evidence for progression  BRONCHIECTASIS WITHOUT ACUTE EXACERBATION Continue current regimen. I reviewed with her the signs and symptoms that go along with an acute exacerbation of her bronchiectasis. I do not believe she has an acute exacerbation her knees to be treated with antibiotics at this time. We will treat if she flares.  Allergic rhinitis Continue same regimen   Baltazar Apo, MD, PhD 02/08/2017, 11:33 AM [facility] [phone] or if no answer [phone]

## 2017-02-08 NOTE — Assessment & Plan Note (Signed)
No evidence of progression at this time. I like to defer therapy given her recent negative culture results. We will plan to repeat her cultures and CT scan of the chest depending on her clinical status, any evidence for progression

## 2017-02-08 NOTE — Assessment & Plan Note (Signed)
Continue current regimen. I reviewed with her the signs and symptoms that go along with an acute exacerbation of her bronchiectasis. I do not believe she has an acute exacerbation her knees to be treated with antibiotics at this time. We will treat if she flares.

## 2017-02-08 NOTE — Assessment & Plan Note (Signed)
Continue same regimen 

## 2017-02-08 NOTE — Patient Instructions (Addendum)
Please continue Spiriva, Symbicort as you have been taking them Continue your omeprazole and fluticasone nasal spray as you have been taking them Please call our office for any changes in sputum amount, color, increase in shortness of breath, fever.  Follow with Dr Delton CoombesByrum in 4 months or sooner if you have any problems.

## 2017-02-12 ENCOUNTER — Telehealth: Payer: Self-pay | Admitting: Emergency Medicine

## 2017-02-12 MED ORDER — AZITHROMYCIN 250 MG PO TABS: ORAL_TABLET | ORAL | 0 refills | Status: DC

## 2017-02-12 NOTE — Telephone Encounter (Signed)
MW  Please Advise in RB's absence-  Pt was instructed by RB to contact us if she has any changes in her sputum. Pt states her sputum is now between clear to tanish color, she is coughing more,her throat is scratchy, breathing is unchanged since last visit. Denies fever,wheezing,chest tightness. She was not prescribed any medication last visit   Versions: 1. Leslye PeerByrum, Robert S, MD (Physician) at 02/08/2017 11:25 AM - Signed    Please continue Spiriva, Symbicort as you have been taking them Continue your omeprazole and fluticasone nasal spray as you have been taking them Please call our office for any changes in sputum amount, color, increase in shortness of breath, fever.  Follow with Dr Delton CoombesByrum in 4 months or sooner if you have any problems.

## 2017-02-12 NOTE — Telephone Encounter (Signed)
z-pak 

## 2017-02-12 NOTE — Telephone Encounter (Signed)
Called and spoke with pt and she is aware of MW recs.  And that the zpak has been sent to her pharmacy.

## 2017-03-12 ENCOUNTER — Telehealth: Payer: Self-pay | Admitting: Emergency Medicine

## 2017-03-12 MED ORDER — AZITHROMYCIN 250 MG PO TABS: ORAL_TABLET | ORAL | 0 refills | Status: DC

## 2017-03-12 MED ORDER — PREDNISONE 20 MG PO TABS: 20.0000 mg | ORAL_TABLET | Freq: Every day | ORAL | 0 refills | Status: DC

## 2017-03-12 NOTE — Telephone Encounter (Signed)
Spoke with pt. She states that she is not feeling well. Reports SOB, cough and wheezing. Cough is producing yellow mucus. Denies chest tightness or fever. States that her symptoms have been "on going for a while." Pt would like have something sent in.  Spoke with RB over the phone about the pt's situation. Per RB >> prescribe a Zpack and Prednisone 20mg  daily #5. Pt is to call us on Monday and report how she is feeling. If she is not improving or her symptoms are the same, she must be seen here in the office.  Spoke with pt again. She is aware of RB's recommendations. Rxs have been sent in. Pt doesn't have a problem with calling us on Monday.  Nothing further was needed at this time.

## 2017-03-12 NOTE — Telephone Encounter (Signed)
lmtcb x1 for pt. 

## 2017-03-15 ENCOUNTER — Telehealth: Payer: Self-pay | Admitting: Emergency Medicine

## 2017-03-15 NOTE — Telephone Encounter (Signed)
Spoke with pt. States that she is calling to give us an update since Friday per RB's request. Pt states that she is improved since starting Zpack and Prednisone. Advised her that if she starts feeling bad again once she stops the medication to please call us for an appointment. She agreed and verbalized understanding. Nothing further was needed.

## 2017-03-18 ENCOUNTER — Other Ambulatory Visit: Payer: Self-pay | Admitting: Emergency Medicine

## 2017-03-22 ENCOUNTER — Other Ambulatory Visit: Payer: Self-pay | Admitting: Emergency Medicine

## 2017-05-17 ENCOUNTER — Telehealth: Payer: Self-pay | Admitting: Emergency Medicine

## 2017-05-17 MED ORDER — PREDNISONE 10 MG PO TABS: ORAL_TABLET | ORAL | 0 refills | Status: DC

## 2017-05-17 NOTE — Telephone Encounter (Signed)
Pt aware of rec's per Dr Delton CoombesByrum.  Prednisone Rx sent to [facility] Pt refused appt this week - states that she only wants to see Dr Delton CoombesByrum and she also has no way of getting out of her home. Pt states that she is currently having a ramp built onto her home and until this is finished she cannot get out of her home.  Pt is requesting an abx be sent to the pharmacy.   Please advise Dr Delton CoombesByrum. Thanks.

## 2017-05-17 NOTE — Telephone Encounter (Signed)
With clear mucous I would prefer to defer abx. I do not want her to build resistance. If her mucous changes color then hand will be forced and we will need to order abx.

## 2017-05-17 NOTE — Telephone Encounter (Signed)
Spoke with pt. She is aware of RB's recommendation. Nothing further was needed. 

## 2017-05-17 NOTE — Telephone Encounter (Signed)
Called and spoke to pt. Pt c/o increase in SOB and prod cough with clear mucus x 7 days. Pt states the SOB is the main concern right now. Her cough is slightly more frequent but the amount of mucus is unchanged from baseline. Pt states she is using her albuterol hfa BID. Pt denies CP/tightness and f/c/s. Pt last seen on 5.7.18.   Dr. Delton CoombesByrum please advise. Thanks.

## 2017-05-17 NOTE — Telephone Encounter (Signed)
Difficult to trouble shoot this over the phone. Would set her up for an acute OV. In meantime ok to start pred 30mg  qd x 3 days, then 20mg  x 3 days, then 10mg  x 3 days to see if she improves.

## 2017-05-21 ENCOUNTER — Telehealth: Payer: Self-pay | Admitting: Emergency Medicine

## 2017-05-21 NOTE — Telephone Encounter (Signed)
Spoke with patient and advised her of MW's recs. Explained to her that since she is already on prednisone, MW advises her to go the ER. Patient declined going to the ER because "she has no way of getting out of the house" because she is having a ramp added on to her house. Advised her to call 911 and they will help her get out of the house. She verbalized understanding.

## 2017-05-21 NOTE — Telephone Encounter (Signed)
Nothing else to try over the phone, if can't get comfortable at rest p taking all her resp meds then needs to go to ER

## 2017-05-21 NOTE — Telephone Encounter (Signed)
Called and spoke with pt and she stated that she she has been taking the prednisone 10 mg taper as directed and she feels that this is not helping her breathing.  She stated that she is having SOB still.  She is now on the 2 tabs daily, this started today and she cannot tell a difference.  Pt also stated for the last 2 days she is having urgency to pee but not having much come out.  Pt is wanting to know what else we can do.  Please advise since RB is night float.  Thanks  Allergies  Allergen Reactions  . Prednisone     Pt stated that this medication made her feel terrible.  Has patient had a PCN reaction causing immediate rash, facial/tongue/throat swelling, SOB or lightheadedness with hypotension: No Has patient had a PCN reaction causing severe rash involving mucus membranes or skin necrosis: No Has patient had a PCN reaction that required hospitalization No Has patient had a PCN reaction occurring within the last 10 years: Yes If all of the above answers are "NO", then may proceed with Cephalosporin use.  . Stiolto Respimat [Tiotropium Bromide-Olodaterol] Palpitations    Dizziness

## 2017-06-14 ENCOUNTER — Other Ambulatory Visit: Payer: Self-pay | Admitting: Emergency Medicine

## 2017-06-15 ENCOUNTER — Encounter: Payer: Self-pay | Admitting: Emergency Medicine

## 2017-06-15 ENCOUNTER — Ambulatory Visit (INDEPENDENT_AMBULATORY_CARE_PROVIDER_SITE_OTHER): Payer: Medicare Other | Admitting: Emergency Medicine

## 2017-06-15 DIAGNOSIS — J449 Chronic obstructive pulmonary disease, unspecified: Secondary | ICD-10-CM

## 2017-06-15 DIAGNOSIS — J470 Bronchiectasis with acute lower respiratory infection: Secondary | ICD-10-CM

## 2017-06-15 DIAGNOSIS — Z23 Encounter for immunization: Secondary | ICD-10-CM | POA: Diagnosis not present

## 2017-06-15 DIAGNOSIS — A310 Pulmonary mycobacterial infection: Secondary | ICD-10-CM

## 2017-06-15 MED ORDER — LEVOFLOXACIN 500 MG PO TABS: 500.0000 mg | ORAL_TABLET | Freq: Every day | ORAL | 0 refills | Status: DC

## 2017-06-15 NOTE — Assessment & Plan Note (Signed)
Overall functional decline with more exertional dyspnea. Unclear whether she's inadequately treated with bronchodilators, dealing with more inflammation due to her bronchiectasis. Could reflect a progression of her COPD, now at age 83. I believe we need to rule out exertional hypoxemia. We will do so today. Also believe it would be reasonable to treat her for possible bronchiectasis flare with antibiotics although her symptoms are not classic for this. We have tried changing her Spiriva and Symbicort to an alternative before but she did not tolerate Stiolto due to tachycardia, agitation.

## 2017-06-15 NOTE — Patient Instructions (Signed)
Please continue your Spiriva and Symbicort as you are taking them Use ProAir as needed for shortness of breath.  Please take levaquin 500mg  daily for 7 days.  Start using your flutter valve in the morning to see if this helps your mucous clearance.  Walking oximetry today on room air Flu shot today.  Follow with Dr Delton CoombesByrum next available.

## 2017-06-15 NOTE — Assessment & Plan Note (Signed)
No clear indication to repeat her CT scan of the chest at this time. If her mucous was more purulent, more frequent then I will do so. I will treat empirically with Levaquin as below.

## 2017-06-15 NOTE — Addendum Note (Signed)
Addended by: Maxwell MarionBLANKENSHIP, Katrice Goel A on: 06/15/2017 02:22 PM   Modules accepted: Orders

## 2017-06-15 NOTE — Assessment & Plan Note (Signed)
Culture negative as of 08/10/16. Consider repeat although no frequent purulent sputum reported.

## 2017-06-15 NOTE — Progress Notes (Signed)
History of Present Illness Karen Costa is a 83 y.o. female with COPD and Bronchiectasis/ MAIC followed by Dr. Lamonte Sakai   Karen Costa is a 83 year old woman who follows for COPD and bronchiectasis.  She also has a history of a right sided pulmonary nodule, RLL scar, bronchiectasis in the setting of mycobacterial disease. Has been treated with clarithromycin and ethambutol, rifampin, followed in the past by Infectious Diseases at [facility] 10/09/16 -- Karen Costa has a history of bronchiectasis in the setting of mycobacterial disease, associated COPD and chronic cough. I treated her for an acute exacerbation of bronchiectasis in late November. We also tried restarting Spiriva in addition to her Symbicort. She has continued to have cough with blood-tinged sputum. Her chest mucous has thinned out compared with last time. She is having more head congestion, clear mucous, has seen some blood in it. She had stopped her flonase for a while, restarted it about 2 weeks ago.   ROV 02/08/17 -- this is a follow-up visit for bronchiectasis and history of mycobacterium avium, associated COPD and chronic cough. Other factors include allergic rhinitis. She has been treated in the past with multidrug therapy for her mycobacterial disease, followed at [facility]. She has not been on antibiotics for [facility] since . Most recent CT scan of the chest was in November 2017 as below. Her most recent sputum was 08/10/16, negative for Mycobacterium. She reports that her cough is usually in the morning, sometimes clear to tan mucous. She has only seen blood once in the last visit, very small amount. Her dyspnea is stable, she remains quite limited.   ROV 06/15/17 -- follow up for COPD and bronchiectasis, MAIC (treated). Treated approximate one month ago with prednisone taper. She describes functional decline and more baseline dyspnea over about 6 months. She coughs up clear mucous, most in the am. Usually clear, sometimes tan.  Overall she is more limited - unclear that she benefited much from the pred. No increase in wheeze.     Review Of Systems: As per history of present illness above   Vital Signs BP 116/62 (BP Location: Left Arm, Cuff Size: Normal)   Pulse (!) 126   Ht _0  (1.6 m)   Wt 90 lb (40.8 kg) Comment: weight is per pt  SpO2 91%   BMI 15.94 kg/m   Physical Exam:  General- No distress,  A&Ox3, pleasant, very thin woman Cardiac: S1, S2, regular rate and rhythm, no murmur Chest: Distant but clear, no wheezing, no crackles Abd.: Soft Non-tender Ext: No clubbing cyanosis, trace edema Neuro:  normal strength Skin: Ecchymoses at both ankles Psych: normal mood and behavior    CT chest 08/10/16 --  COMPARISON:  04/20/2016, 06/12/2014.  FINDINGS: Cardiovascular: Atherosclerotic calcification of the arterial vasculature, including coronary arteries. Heart is enlarged and accentuated by a pectus deformity. No pericardial effusion.  Mediastinum/Nodes: Mediastinal lymph nodes are not enlarged by CT size criteria. Hilar regions are difficult to definitively evaluate without IV contrast. No axillary adenopathy. Esophagus is grossly unremarkable.  Lungs/Pleura: Biapical pleural parenchymal scarring, right greater than left. Fairly extensive bronchiectasis, bronchial wall thickening, peribronchovascular nodularity, mucoid impaction and scattered volume loss, progressive from 06/12/2014. Mosaic pulmonary parenchymal attenuation/ground glass on the left, nonspecific. No pleural fluid. Airway is unremarkable.  Upper Abdomen: 10 mm low-attenuation lesion in the right hepatic lobe is unchanged and likely a cyst. Visualized portions of the liver, adrenal glands, right kidney scratch sec kidneys, spleen, pancreas and stomach are grossly unremarkable.  Musculoskeletal: No worrisome lytic or sclerotic lesions. Degenerative changes are seen in the spine.  IMPRESSION: 1. Pulmonary  parenchymal pattern of extensive bronchiectasis, bronchial wall thickening, peribronchovascular nodularity, mucoid impaction and scattered volume loss, progressive from 06/12/2014, most suggestive of mycobacterium avium complex (mac). 2. Mosaic ground-glass in the left hemi thorax is nonspecific. The superimposed acute infectious/ inflammatory process cannot be excluded. 3. Aortic atherosclerosis (ICD10-170.0). Coronary artery calcification   Assessment/Plan  COPD (chronic obstructive pulmonary disease) (HCC) Overall functional decline with more exertional dyspnea. Unclear whether she's inadequately treated with bronchodilators, dealing with more inflammation due to her bronchiectasis. Could reflect a progression of her COPD, now at age 89. I believe we need to rule out exertional hypoxemia. We will do so today. Also believe it would be reasonable to treat her for possible bronchiectasis flare with antibiotics although her symptoms are not classic for this. We have tried changing her Spiriva and Symbicort to an alternative before but she did not tolerate Stiolto due to tachycardia, agitation.  BRONCHIECTASIS WITHOUT ACUTE EXACERBATION No clear indication to repeat her CT scan of the chest at this time. If her mucous was more purulent, more frequent then I will do so. I will treat empirically with Levaquin as below.  MAIC (mycobacterium avium-intracellulare complex) Culture negative as of 08/10/16. Consider repeat although no frequent purulent sputum reported.   Baltazar Apo, MD, PhD 06/15/2017, 2:00 PM [facility] [phone] or if no answer [phone]

## 2017-06-16 ENCOUNTER — Telehealth: Payer: Self-pay | Admitting: Emergency Medicine

## 2017-06-16 NOTE — Telephone Encounter (Signed)
ATC pt. Husband answered the phone and tried to hand the phone to pt and accidentally hung up. Attempted to call back but received a busy signal.

## 2017-06-16 NOTE — Telephone Encounter (Signed)
Pt returning call and can be reached @ same.Karen Costa ° °

## 2017-06-17 NOTE — Telephone Encounter (Signed)
Spoke with pt. She is aware that she can use her flutter valve 2-3 times per day, 10 breaths each time she uses it. Pt was very appreciative. Nothing further was needed.

## 2017-06-17 NOTE — Telephone Encounter (Signed)
Attempted to call pt. No answer and she doesn't have voicemail. Will try back.

## 2017-06-17 NOTE — Telephone Encounter (Signed)
Patient have flutter valve. Patient hope she haven't caused nurse too much trouble. Can patient use flutter valve several times a day as needed? Patient request for nurse to return call.

## 2017-07-20 ENCOUNTER — Encounter: Payer: Self-pay | Admitting: Emergency Medicine

## 2017-07-20 ENCOUNTER — Ambulatory Visit (INDEPENDENT_AMBULATORY_CARE_PROVIDER_SITE_OTHER): Payer: Medicare Other | Admitting: Emergency Medicine

## 2017-07-20 DIAGNOSIS — J449 Chronic obstructive pulmonary disease, unspecified: Secondary | ICD-10-CM | POA: Diagnosis not present

## 2017-07-20 DIAGNOSIS — J479 Bronchiectasis, uncomplicated: Secondary | ICD-10-CM

## 2017-07-20 DIAGNOSIS — A310 Pulmonary mycobacterial infection: Secondary | ICD-10-CM

## 2017-07-20 MED ORDER — DOXYCYCLINE HYCLATE 100 MG PO TABS: 100.0000 mg | ORAL_TABLET | Freq: Two times a day (BID) | ORAL | 3 refills | Status: AC

## 2017-07-20 MED ORDER — AZITHROMYCIN 250 MG PO TABS: ORAL_TABLET | ORAL | 3 refills | Status: AC

## 2017-07-20 MED ORDER — LEVOFLOXACIN 250 MG PO TABS: 250.0000 mg | ORAL_TABLET | Freq: Every day | ORAL | 3 refills | Status: AC

## 2017-07-20 NOTE — Assessment & Plan Note (Addendum)
Continue flutter valve. I like to start a rotation of antibiotics to see if she benefits. Consider reimaging in the coming year. May need repeat sputum culture depending on clinical course  We will start a regimen of antibiotics, rotating each month: - azithromycin for 5 days  - doxycycline for 7 days - levaquin for 5 days - then start over Follow with Dr Delton Coombes in 3 months or sooner if you have any problems. Flu shot is up to date

## 2017-07-20 NOTE — Patient Instructions (Addendum)
Please continue Spiriva and Symbicort as you have been taking them We will start a regimen of antibiotics, rotating each month: - azithromycin for 5 days  - doxycycline for 7 days - levaquin for 7 days - then start over Follow with Dr Delton Coombes in 3 months or sooner if you have any problems. Flu shot is up to date

## 2017-07-20 NOTE — Progress Notes (Signed)
History of Present Illness Karen Costa is a 83 y.o. female with COPD and Bronchiectasis/ MAIC followed by Dr. Lamonte Sakai   Karen Costa is a 83 year old woman who follows for COPD and bronchiectasis.  She also has a history of a right sided pulmonary nodule, RLL scar, bronchiectasis in the setting of mycobacterial disease. Has been treated with clarithromycin and ethambutol, rifampin, followed in the past by Infectious Diseases at [facility] 10/09/16 -- Karen Costa has a history of bronchiectasis in the setting of mycobacterial disease, associated COPD and chronic cough. I treated her for an acute exacerbation of bronchiectasis in late November. We also tried restarting Spiriva in addition to her Symbicort. She has continued to have cough with blood-tinged sputum. Her chest mucous has thinned out compared with last time. She is having more head congestion, clear mucous, has seen some blood in it. She had stopped her flonase for a while, restarted it about 2 weeks ago.   ROV 02/08/17 -- this is a follow-up visit for bronchiectasis and history of mycobacterium avium, associated COPD and chronic cough. Other factors include allergic rhinitis. She has been treated in the past with multidrug therapy for her mycobacterial disease, followed at [facility]. She has not been on antibiotics for [facility] since . Most recent CT scan of the chest was in November 2017 as below. Her most recent sputum was 08/10/16, negative for Mycobacterium. She reports that her cough is usually in the morning, sometimes clear to tan mucous. She has only seen blood once in the last visit, very small amount. Her dyspnea is stable, she remains quite limited.   ROV 06/15/17 -- follow up for COPD and bronchiectasis, MAIC (treated). Treated approximate one month ago with prednisone taper. She describes functional decline and more baseline dyspnea over about 6 months. She coughs up clear mucous, most in the am. Usually clear, sometimes tan.  Overall she is more limited - unclear that she benefited much from the pred. No increase in wheeze.   ROV 07/20/17 -- 82 year old woman with a history of bronchiectasis, COPD, MAIC that has been treated in the past. Her sputum was negative for this 08/10/16. She has been having progressive problems is line dyspnea, functional decline, cough. I treated her with Levaquin one month ago for possible exacerbation, increased her flutter valve use, bronchodilators are  Spiriva and Symbicort. Her sputum is clear, occasionally yellow. She is using flutter valve tid. Her breathing is about wher it was before, probably improved while on the levaquin.     Review Of Systems: As per history of present illness above   Vital Signs BP 122/80 (BP Location: Left Arm, Cuff Size: Small)   Pulse (!) 114   Wt 90 lb (40.8 kg)   SpO2 93%   BMI 15.94 kg/m   Physical Exam:  General- No distress,  A&Ox3, pleasant, very thin woman Cardiac: S1, S2, regular rate and rhythm, no murmur Chest: Distant but clear, no wheezing, no crackles Abd.: Soft Non-tender Ext: No clubbing cyanosis, trace edema Neuro:  normal strength Skin: Ecchymoses at both ankles Psych: normal mood and behavior    CT chest 08/10/16 --  COMPARISON:  04/20/2016, 06/12/2014.  FINDINGS: Cardiovascular: Atherosclerotic calcification of the arterial vasculature, including coronary arteries. Heart is enlarged and accentuated by a pectus deformity. No pericardial effusion.  Mediastinum/Nodes: Mediastinal lymph nodes are not enlarged by CT size criteria. Hilar regions are difficult to definitively evaluate without IV contrast. No axillary adenopathy. Esophagus is grossly unremarkable.  Lungs/Pleura:  Biapical pleural parenchymal scarring, right greater than left. Fairly extensive bronchiectasis, bronchial wall thickening, peribronchovascular nodularity, mucoid impaction and scattered volume loss, progressive from 06/12/2014. Mosaic  pulmonary parenchymal attenuation/ground glass on the left, nonspecific. No pleural fluid. Airway is unremarkable.  Upper Abdomen: 10 mm low-attenuation lesion in the right hepatic lobe is unchanged and likely a cyst. Visualized portions of the liver, adrenal glands, right kidney scratch sec kidneys, spleen, pancreas and stomach are grossly unremarkable.  Musculoskeletal: No worrisome lytic or sclerotic lesions. Degenerative changes are seen in the spine.  IMPRESSION: 1. Pulmonary parenchymal pattern of extensive bronchiectasis, bronchial wall thickening, peribronchovascular nodularity, mucoid impaction and scattered volume loss, progressive from 06/12/2014, most suggestive of mycobacterium avium complex (mac). 2. Mosaic ground-glass in the left hemi thorax is nonspecific. The superimposed acute infectious/ inflammatory process cannot be excluded. 3. Aortic atherosclerosis (ICD10-170.0). Coronary artery calcification   Assessment/Plan  MAIC (mycobacterium avium-intracellulare complex) Negative as of November 2017. Depending on clinical history we will consider repeat sputum culture  COPD (chronic obstructive pulmonary disease) (HCC) Continue current Spiriva, Symbicort. She did not tolerate Stiolto  BRONCHIECTASIS WITHOUT ACUTE EXACERBATION Continue flutter valve. I like to start a rotation of antibiotics to see if she benefits. Consider reimaging in the coming year. May need repeat sputum culture depending on clinical course  We will start a regimen of antibiotics, rotating each month: - azithromycin for 5 days  - doxycycline for 7 days - levaquin for 5 days - then start over Follow with Dr Lamonte Sakai in 3 months or sooner if you have any problems. Flu shot is up to date   Karen Apo, MD, PhD 07/20/2017, 11:42 AM [facility] [phone] or if no answer [phone]

## 2017-07-20 NOTE — Assessment & Plan Note (Signed)
Continue current Spiriva, Symbicort. She did not tolerate Stiolto

## 2017-07-20 NOTE — Assessment & Plan Note (Signed)
Negative as of November 2017. Depending on clinical history we will consider repeat sputum culture

## 2017-08-23 ENCOUNTER — Telehealth: Payer: Self-pay | Admitting: Emergency Medicine

## 2017-08-23 NOTE — Telephone Encounter (Signed)
RB last note said  We will start a regimen of antibiotics, rotating each month: - azithromycin for 5 days  - doxycycline for 7 days - levaquin for 5 days - then start over   Defer to RB to respond when he is back

## 2017-08-23 NOTE — Telephone Encounter (Signed)
Spoke with pt, she states she is going to stop the doixycycline because she cannot tolerate it. I advised her that we would call her after RB reviews. Pt understood.

## 2017-08-23 NOTE — Telephone Encounter (Signed)
Called and spoke with pt and she stated that she is to take the doxy for the month of November and she stated that she has 4 tabs left and she wanted to know if she would be able to stop this now.  She stated that she has noticed some red specks of blood in her mucus that she is blowing out of her nose. She stated that it is mostly pink, but sometimes will be red.  RB is out of the office today.  Will forward to DOD.  Please advise. Thanks   Allergies  Allergen Reactions  . Prednisone     Pt stated that this medication made her feel terrible.  Has patient had a PCN reaction causing immediate rash, facial/tongue/throat swelling, SOB or lightheadedness with hypotension: No Has patient had a PCN reaction causing severe rash involving mucus membranes or skin necrosis: No Has patient had a PCN reaction that required hospitalization No Has patient had a PCN reaction occurring within the last 10 years: Yes If all of the above answers are "NO", then may proceed with Cephalosporin use.  . Stiolto Respimat [Tiotropium Bromide-Olodaterol] Palpitations    Dizziness

## 2017-08-25 NOTE — Telephone Encounter (Signed)
RB please advise. Thanks.  

## 2017-08-25 NOTE — Telephone Encounter (Signed)
RB is not available for messages but I did speak to him over the phone. Per RB >> pt can stop taking doxy, we will just rotate Azithromycin and Levaquin every other month.  Spoke with pt. She is aware of RB's recommendation. Nothing further was needed.

## 2017-09-10 ENCOUNTER — Telehealth: Payer: Self-pay | Admitting: Emergency Medicine

## 2017-09-10 NOTE — Telephone Encounter (Signed)
Spoke with pt. She states that her husband passed away this morning. I expressed my condolences. Pt just wanted to make sure that it was okay to go ahead and start taking Levaquin. Per RB's instructions, pt was to start taking this medication this month. Advised pt that she could go ahead and start taking Levaquin. Nothing further was needed.

## 2017-09-14 ENCOUNTER — Other Ambulatory Visit: Payer: Self-pay | Admitting: Emergency Medicine

## 2017-09-20 ENCOUNTER — Other Ambulatory Visit: Payer: Self-pay | Admitting: Emergency Medicine

## 2017-09-29 ENCOUNTER — Telehealth: Payer: Self-pay | Admitting: Emergency Medicine

## 2017-09-29 NOTE — Telephone Encounter (Signed)
Spoke with pt. Pt states she is having an increase in SOB and weakness. Pt states she is unable to stand because of the weakness. Advised pt she needs to go to ED to be evaluated, pt refused and states she will wait for hospice to come to the house today. Pt states she will call back if needed.   Will send to Dr. Delton CoombesByrum as Lorain ChildesFYI.

## 2017-10-04 NOTE — Telephone Encounter (Signed)
Thank you :)

## 2017-10-06 ENCOUNTER — Telehealth: Payer: Self-pay | Admitting: Emergency Medicine

## 2017-10-06 ENCOUNTER — Encounter (HOSPITAL_COMMUNITY): Payer: Self-pay | Admitting: Emergency Medicine

## 2017-10-06 ENCOUNTER — Emergency Department (HOSPITAL_COMMUNITY)
Admission: EM | Admit: 2017-10-06 | Discharge: 2017-10-06 | Disposition: A | Discharge status: Home / Self Care | Payer: Medicare Other | Attending: Emergency Medicine | Admitting: Emergency Medicine

## 2017-10-06 ENCOUNTER — Emergency Department (HOSPITAL_COMMUNITY): Payer: Medicare Other

## 2017-10-06 DIAGNOSIS — Z7982 Long term (current) use of aspirin: Secondary | ICD-10-CM | POA: Diagnosis not present

## 2017-10-06 DIAGNOSIS — J479 Bronchiectasis, uncomplicated: Secondary | ICD-10-CM | POA: Diagnosis not present

## 2017-10-06 DIAGNOSIS — E46 Unspecified protein-calorie malnutrition: Secondary | ICD-10-CM | POA: Insufficient documentation

## 2017-10-06 DIAGNOSIS — R5381 Other malaise: Secondary | ICD-10-CM | POA: Diagnosis present

## 2017-10-06 DIAGNOSIS — J449 Chronic obstructive pulmonary disease, unspecified: Secondary | ICD-10-CM

## 2017-10-06 DIAGNOSIS — Z87891 Personal history of nicotine dependence: Secondary | ICD-10-CM | POA: Insufficient documentation

## 2017-10-06 DIAGNOSIS — Z79899 Other long term (current) drug therapy: Secondary | ICD-10-CM | POA: Insufficient documentation

## 2017-10-06 LAB — COMPREHENSIVE METABOLIC PANEL
ALK PHOS: 97 U/L (ref 38–126)
ALT: 19 U/L (ref 14–54)
AST: 27 U/L (ref 15–41)
Albumin: 3.3 g/dL — ABNORMAL LOW (ref 3.5–5.0)
Anion gap: 7 (ref 5–15)
BUN: 16 mg/dL (ref 6–20)
CALCIUM: 9.7 mg/dL (ref 8.9–10.3)
CO2: 28 mmol/L (ref 22–32)
CREATININE: 0.45 mg/dL (ref 0.44–1.00)
Chloride: 101 mmol/L (ref 101–111)
Glucose, Bld: 114 mg/dL — ABNORMAL HIGH (ref 65–99)
Potassium: 4.5 mmol/L (ref 3.5–5.1)
SODIUM: 136 mmol/L (ref 135–145)
Total Bilirubin: 0.7 mg/dL (ref 0.3–1.2)
Total Protein: 7 g/dL (ref 6.5–8.1)

## 2017-10-06 LAB — URINALYSIS, ROUTINE W REFLEX MICROSCOPIC
BILIRUBIN URINE: NEGATIVE
Glucose, UA: NEGATIVE mg/dL
HGB URINE DIPSTICK: NEGATIVE
KETONES UR: NEGATIVE mg/dL
Leukocytes, UA: NEGATIVE
NITRITE: NEGATIVE
PH: 6 (ref 5.0–8.0)
Protein, ur: NEGATIVE mg/dL
Specific Gravity, Urine: 1.016 (ref 1.005–1.030)

## 2017-10-06 LAB — I-STAT TROPONIN, ED: Troponin i, poc: 0 ng/mL (ref 0.00–0.08)

## 2017-10-06 LAB — CBC
HEMATOCRIT: 46.7 % — AB (ref 36.0–46.0)
HEMOGLOBIN: 15.6 g/dL — AB (ref 12.0–15.0)
MCH: 31.5 pg (ref 26.0–34.0)
MCHC: 33.4 g/dL (ref 30.0–36.0)
MCV: 94.3 fL (ref 78.0–100.0)
Platelets: 282 10*3/uL (ref 150–400)
RBC: 4.95 MIL/uL (ref 3.87–5.11)
RDW: 13.8 % (ref 11.5–15.5)
WBC: 9.3 10*3/uL (ref 4.0–10.5)

## 2017-10-06 MED ORDER — SODIUM CHLORIDE 0.9 % IV SOLN
INTRAVENOUS | Status: DC
  Administered 2017-10-06: 15:00:00 via INTRAVENOUS

## 2017-10-06 MED ORDER — SODIUM CHLORIDE 0.9 % IV BOLUS (SEPSIS)
500.0000 mL | Freq: Once | INTRAVENOUS | Status: AC
Start: 2017-10-06 — End: 2017-10-06
  Administered 2017-10-06: 500 mL via INTRAVENOUS

## 2017-10-06 NOTE — Telephone Encounter (Signed)
Spoke with pt. States that she is not feeling well. Reports increased weakness, fatigue and increased SOB. Denies chest tightness, coughing, wheezing or fever. States that she is so weak that she can't stand, she has not been able to stand since 09/27/17. Pt does not want to go to the ED but will go if RB suggests it.  RB  - please advise. Thanks.

## 2017-10-06 NOTE — Care Management Note (Signed)
Case Management Note  Patient Details  Name: Karen Costa MRN: [id number] Date of Birth: April 30, 1930  Subjective/Objective:                  84 y.o. female w hx copd, chronic resp failure, c/o increased generalized weakness, too weak to stand, and slowly progressive sob for the past few weeks. States has been unable to stand or walk for past couple weeks  Action/Plan: CM consulted for HHS.  CM noted that pt was active with hospice during chart review.  Spoke with Dr. Denton LankSteinl who states he would like for pt to receive PT to increase some strength to possibly improve quality of life.  He additionally mentioned that family would like Dr. Delton CoombesByrum involved in her hospice decisions/care and per Dr. Denton LankSteinl, Dr. Delton CoombesByrum stated he is willingly available.  Spoke with pt and family at bedside who states [facility]  Advised the pt and family CM would contact them with with Dr. Denton LankSteinl, pt, and families requests.  [facility] and advised of conversations.  They will contact the pt's nurse with information and return my call if any further questions arise.  No further CM needs noted at this time.  Expected Discharge Date:   10/06/2017               Expected Discharge Plan:  Home w Hospice Care  Discharge planning Services  CM Consult  Post Acute Care Choice:  Hospice Choice offered to:  Patient, Adult Children  [facility]  [facility]  Status of Service:  Completed, signed off  Rica KoyanagiKritzer, Hedda Crumbley N, RN 10/06/2017, 4:02 PM

## 2017-10-06 NOTE — ED Triage Notes (Addendum)
Per EMS, pt is coming from home with complaints of chronic coccyx pain that has been present since the age of 84. Per EMS pt is AO x4. Per EMS pt is usually bed bound.

## 2017-10-06 NOTE — Telephone Encounter (Signed)
Spoke with the pt and notified of recs per RB  She verbalized understanding  [facility] now

## 2017-10-06 NOTE — ED Notes (Signed)
Bed: WA20 Expected date:  Expected time:  Means of arrival:  Comments: EMS-pain

## 2017-10-06 NOTE — Discharge Instructions (Signed)
It was our pleasure to provide your ER care today - we hope that you feel better.  Follow up closely with Dr Delton CoombesByrum as outpatient - call office to arrange appointment.  He did indicate he would be willing to serve as your outpatient/hospice doctor as need.   Work with hospice care/home health agency to facilitate maximizing home health services.   Consider supplementing nutrition with nutritious shakes such as Ensure or Boost.  Return to ER if worse, new symptoms, fevers, increased difficulty breathing, other concern.

## 2017-10-06 NOTE — Telephone Encounter (Signed)
Spoke with pt's son, Lyman BishopLawrence. States that [facility]. Advised him to let her care staff know that she is a pt here at our office and they would like to have the physician on call for our office to come and see her. He verbalized understanding. Nothing further was needed.

## 2017-10-06 NOTE — ED Notes (Signed)
Pt son states that pt provider wants her to be evaluated today for possible admission related to her chronic lung disease. Pt does not wear oxygen at home.

## 2017-10-06 NOTE — ED Provider Notes (Signed)
[facility]-EMERGENCY DEPT Provider Note   CSN: [id number] Arrival date & time: 10/06/17  1145     History   Chief Complaint Chief Complaint  Patient presents with  . Recurrent Pneumonia    HPI Karen Costa is a 84 y.o. female.  Patient w hx copd, chronic resp failure, c/o increased generalized weakness, too weak to stand, and slowly progressive sob for the past few weeks. States has been unable to stand or walk for past couple weeks. States she lost her husband approximately 1 month ago, and then asked hospice/hospice nurse to stay and help care for her at home. Patient denies acute or abrupt worsening today. She reports calling her pulmonary physicians office today to try to move up her appointment there, and was advised to go to the ER.  Pt denies fever or chills. Denies increasing cough. No chest pain. No swelling. +unspecified weight loss.    The history is provided by the patient.    Past Medical History:  Diagnosis Date  . Arthritis   . Breast calcifications    right breast  . Breast calcifications, right 06/27/2012   Surgically excised on 17Oct13   . Bronchiectasis   . COPD (chronic obstructive pulmonary disease) (HCC)   . GERD (gastroesophageal reflux disease)   . Osteoporosis     Patient Active Problem List   Diagnosis Date Noted  . Pneumothorax on right 04/29/2016  . MAIC (mycobacterium avium-intracellulare complex) (HCC) 11/23/2014  . Breast calcifications, right 06/27/2012  . DYSPNEA 05/13/2010  . CHEST PAIN 05/13/2010  . BRONCHIECTASIS WITHOUT ACUTE EXACERBATION 01/10/2008  . LUNG NODULE 01/10/2008  . Allergic rhinitis 07/08/2007  . COPD (chronic obstructive pulmonary disease) (HCC) 07/08/2007    Past Surgical History:  Procedure Laterality Date  . BREAST BIOPSY  07/21/2012   Procedure: BREAST BIOPSY WITH NEEDLE LOCALIZATION;  Surgeon: Currie Paris, MD;  Location: [facility];  Service: General;   Laterality: Right;  . COLONOSCOPY    . EYE SURGERY     rt cataract  . FRACTURE SURGERY     rt foot  . TONSILLECTOMY      OB History    No data available       Home Medications    Prior to Admission medications   Medication Sig Start Date End Date Taking? Authorizing Provider  aspirin EC 81 MG tablet Take 81 mg by mouth daily.    [provider]  Biotin 1000 MCG tablet Take 1,000 mcg by mouth daily.     [provider]  doxycycline (VIBRA-TABS) 100 MG tablet Take 1 tablet (100 mg total) by mouth 2 (two) times daily. 07/20/17   Leslye Peer, MD  fluticasone (FLONASE) 50 MCG/ACT nasal spray Place 2 sprays into both nostrils daily.    [provider]  levofloxacin (LEVAQUIN) 250 MG tablet Take 1 tablet (250 mg total) by mouth daily. 07/20/17   Leslye Peer, MD  omeprazole (PRILOSEC) 20 MG capsule Take 20 mg by mouth daily.      [provider]  PROAIR HFA [phone] Base) MCG/ACT inhaler 2 puffs in lungs every 6 hours as needed wheezing or shortness of breath. 06/14/17   Leslye Peer, MD  raloxifene (EVISTA) 60 MG tablet Take 60 mg by mouth daily.    [provider]  SPIRIVA HANDIHALER 18 MCG inhalation capsule INHALE THE CONTENTS OF 1 CAPSULE ONCE DAILY 09/15/17   Leslye Peer, MD  SYMBICORT 160-4.5 MCG/ACT inhaler  USE 2 PUFFS TWICE A DAY 09/20/17   Leslye PeerByrum, Robert S, MD    Family History Family History  Problem Relation Age of Onset  . Heart disease Father   . Cancer Father        colon  . Breast cancer Mother   . Cancer Mother        breast  . Heart disease Brother   . Heart disease Sister   . Cancer Sister        colon    Social History Social History   Tobacco Use  . Smoking status: Former Smoker    Years: 25.00    Types: Cigarettes    Last attempt to quit: 10/05/1990    Years since quitting: 27.0  . Smokeless tobacco: Never Used  Substance Use Topics  . Alcohol use: Yes    Alcohol/week: 1.2 oz    Types: 2  Cans of beer per week  . Drug use: No     Allergies   Prednisone and Stiolto respimat [tiotropium bromide-olodaterol]   Review of Systems Review of Systems  Constitutional: Negative for fever.  HENT: Negative for sore throat.   Eyes: Negative for redness.  Respiratory: Positive for shortness of breath.   Cardiovascular: Negative for chest pain and leg swelling.  Gastrointestinal: Negative for abdominal pain and vomiting.  Genitourinary: Negative for dysuria and flank pain.  Musculoskeletal: Negative for back pain and neck pain.  Skin: Negative for rash.  Neurological: Negative for headaches.  Hematological: Does not bruise/bleed easily.  Psychiatric/Behavioral: Negative for confusion.     Physical Exam Updated Vital Signs BP (!) 150/77 (BP Location: Left Arm)   Pulse (!) 113   Temp 97.7 F (36.5 C) (Oral)   Resp 18   SpO2 93%   Physical Exam  Constitutional:  Cachectic, frail appearing.  HENT:  Head: Atraumatic.  Eyes: Conjunctivae are normal. Pupils are equal, round, and reactive to light. No scleral icterus.  Neck: Normal range of motion. Neck supple. No tracheal deviation present.  Cardiovascular: Regular rhythm, normal heart sounds and intact distal pulses. Exam reveals no gallop and no friction rub.  No murmur heard. Pulmonary/Chest: Effort normal and breath sounds normal. No respiratory distress.  Abdominal: Soft. Normal appearance and bowel sounds are normal. She exhibits no distension. There is no tenderness.  Genitourinary:  Genitourinary Comments: No cva tenderness  Musculoskeletal: She exhibits no edema or tenderness.  Neurological: She is alert.  Skin: Skin is warm and dry. No rash noted.  Psychiatric: She has a normal mood and affect.  Nursing note and vitals reviewed.    ED Treatments / Results  Labs (all labs ordered are listed, but only abnormal results are displayed) Results for orders placed or performed during the hospital encounter of  10/06/17  Comprehensive metabolic panel  Result Value Ref Range   Sodium 136 135 - 145 mmol/L   Potassium 4.5 3.5 - 5.1 mmol/L   Chloride 101 101 - 111 mmol/L   CO2 28 22 - 32 mmol/L   Glucose, Bld 114 (H) 65 - 99 mg/dL   BUN 16 6 - 20 mg/dL   Creatinine, Ser 5.620.45 0.44 - 1.00 mg/dL   Calcium 9.7 8.9 - 13.010.3 mg/dL   Total Protein 7.0 6.5 - 8.1 g/dL   Albumin 3.3 (L) 3.5 - 5.0 g/dL   AST 27 15 - 41 U/L   ALT 19 14 - 54 U/L   Alkaline Phosphatase 97 38 - 126 U/L   Total Bilirubin 0.7  0.3 - 1.2 mg/dL   GFR calc non Af Amer >60 >60 mL/min   GFR calc Af Amer >60 >60 mL/min   Anion gap 7 5 - 15  Urinalysis, Routine w reflex microscopic  Result Value Ref Range   Color, Urine YELLOW YELLOW   APPearance CLEAR CLEAR   Specific Gravity, Urine 1.016 1.005 - 1.030   pH 6.0 5.0 - 8.0   Glucose, UA NEGATIVE NEGATIVE mg/dL   Hgb urine dipstick NEGATIVE NEGATIVE   Bilirubin Urine NEGATIVE NEGATIVE   Ketones, ur NEGATIVE NEGATIVE mg/dL   Protein, ur NEGATIVE NEGATIVE mg/dL   Nitrite NEGATIVE NEGATIVE   Leukocytes, UA NEGATIVE NEGATIVE  CBC  Result Value Ref Range   WBC 9.3 4.0 - 10.5 K/uL   RBC 4.95 3.87 - 5.11 MIL/uL   Hemoglobin 15.6 (H) 12.0 - 15.0 g/dL   HCT 95.6 (H) 21.3 - 08.6 %   MCV 94.3 78.0 - 100.0 fL   MCH 31.5 26.0 - 34.0 pg   MCHC 33.4 30.0 - 36.0 g/dL   RDW 57.8 46.9 - 62.9 %   Platelets 282 150 - 400 K/uL  I-stat troponin, ED  Result Value Ref Range   Troponin i, poc 0.00 0.00 - 0.08 ng/mL   Comment 3            EKG  EKG Interpretation  Date/Time:  Wednesday October 06 2017 13:12:56 EST Ventricular Rate:  105 PR Interval:    QRS Duration: 105 QT Interval:  355 QTC Calculation: 470 R Axis:   -71 Text Interpretation:  Sinus tachycardia Ventricular premature complex Incomplete RBBB and LAFB No significant change since last tracing Confirmed by Cathren Laine ([id number]) on 10/06/2017 1:44:20 PM       Radiology Dg Chest 2 View  Result Date: 10/06/2017 CLINICAL DATA:   Chronic lung disease. Shortness of breath. Follow-up. EXAM: CHEST  2 VIEW COMPARISON:  06/12/2016 FINDINGS: Artifact overlies chest. Heart size is normal. There is aortic atherosclerosis. The lungs show a widespread hyperinflation and pulmonary scarring, similar to the previous exam. No sign of consolidation, collapse or effusion. No acute bone finding. IMPRESSION: Chronic lung disease with hyperinflation and pulmonary scarring. No change appreciable since 2017. Electronically Signed   By: Paulina Fusi M.D.   On: 10/06/2017 13:12    Procedures Procedures (including critical care time)  Medications Ordered in ED Medications  0.9 %  sodium chloride infusion (not administered)     Initial Impression / Assessment and Plan / ED Course  I have reviewed the triage vital signs and the nursing notes.  Pertinent labs & imaging results that were available during my care of the patient were reviewed by me and considered in my medical decision making (see chart for details).  Iv ns. Labs. Cxr.  Reviewed nursing notes and prior charts for additional history.   No new pna on cxr. Labs ok.  Iv ns bolus. Po fluids.  Dr Delton Coombes paged - he indicates if not acute process/acute need for admission, he can follow up as outpatient and is willing to serve as pts hospice doctor as need.  He indicates he is in agreement with maximizing outpatient/home health services.   Case management called - they will try to facilitate home health services via hospice.     Final Clinical Impressions(s) / ED Diagnoses   Final diagnoses:  None    ED Discharge Orders    None       Cathren Laine, MD 10/06/17 1558

## 2017-10-06 NOTE — Telephone Encounter (Signed)
I had seen that she was meeting with hospice, but not clear to me that this has been established yet. I do not believe we will be able to manage her needs at home if they are not already involved. I know she would like to avoid, but she likely needs to go to ED, will likely be admitted. Unless she adamantly wants to stay home, understanding that she may continue to decline there, I recommend that she goes to the ED.

## 2017-10-07 ENCOUNTER — Telehealth: Payer: Self-pay | Admitting: Emergency Medicine

## 2017-10-07 NOTE — Telephone Encounter (Signed)
Spoke with pt's son and he wants to know if the pt should start the Zpak today since she was just released from the hospital last night. She has an appt with RB on 10/28/2017. RB please advise if she should hold off for now.

## 2017-10-08 NOTE — Telephone Encounter (Signed)
Spoke with RB. He recommends that she take Zpack this month.  Spoke with pt's son, Lyman BishopLawrence. He is aware of RB's response. Nothing further was needed.

## 2017-10-08 NOTE — Telephone Encounter (Signed)
Pt son calling back about when/if Pt should start the Zpak. Cb [phone].

## 2017-10-12 ENCOUNTER — Telehealth: Payer: Self-pay | Admitting: Emergency Medicine

## 2017-10-28 ENCOUNTER — Ambulatory Visit: Payer: Medicare Other | Admitting: Emergency Medicine

## 2017-11-05 NOTE — Telephone Encounter (Signed)
Spoke with pt's son, Lyman BishopLawrence. He wanted to let us know that the pt passed away this morning. He thanked us for taking such good care of her. Nothing further was needed.

## 2017-11-05 DEATH — deceased

## 2018-06-01 IMAGING — CT CT CHEST HIGH RESOLUTION W/O CM
3 of 7 series · 16 of 30 positions shown, 18 images · non-contrast
Comparison: 06/12/2014 and 03/13/2011.

CLINICAL DATA: Bronchiectasis, shortness of breath, cough.

EXAM:
CT CHEST WITHOUT CONTRAST
TECHNIQUE: Multidetector CT imaging of the chest was performed following the
standard protocol without intravenous contrast. High resolution
imaging of the lungs, as well as inspiratory and expiratory imaging,
was performed.

[Series 3: chest w/o 5mm · axial · non-contrast · 0.64mm/px · z∈[-237,-12]mm · 6 of 126 slices shown, 8 images]
[im 18/126  mediastinal]
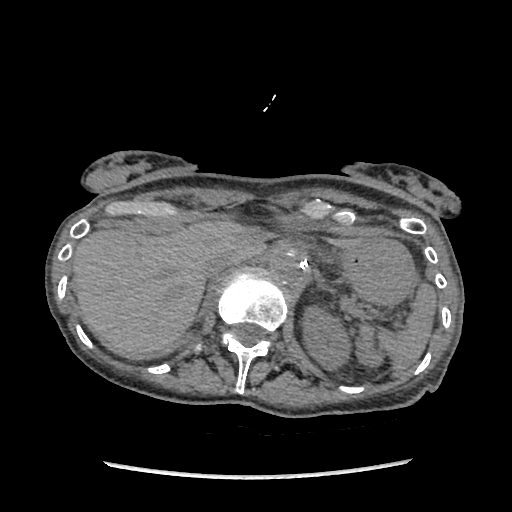
[im 18/126  lung]
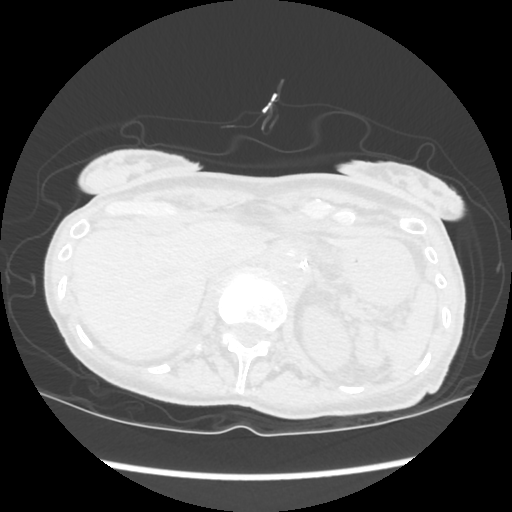
[im 36/126  lung]
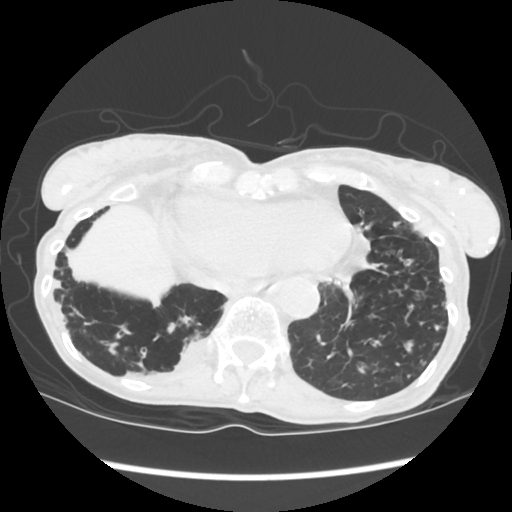
[im 54/126  lung]
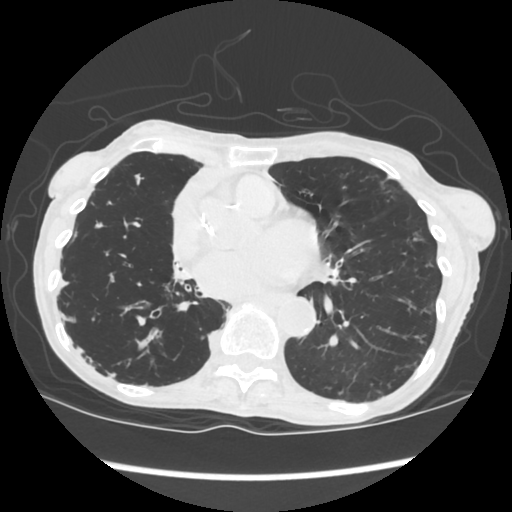
[im 72/126  lung]
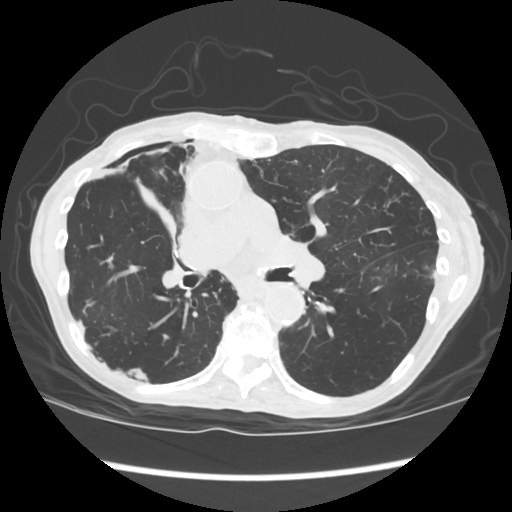
[im 90/126  mediastinal]
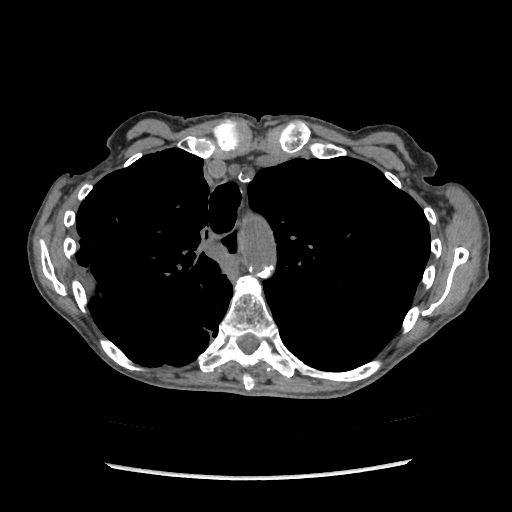
[im 90/126  lung]
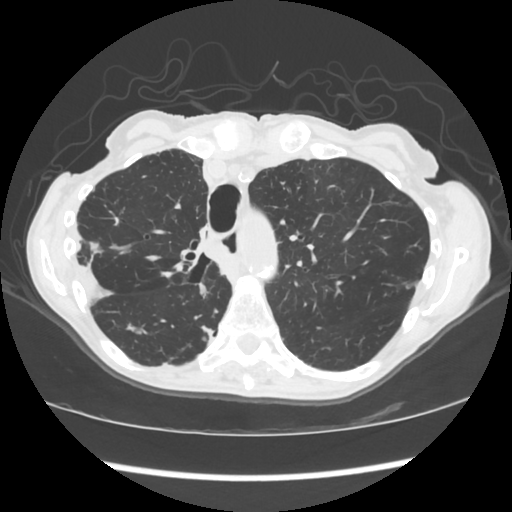
[im 108/126  lung]
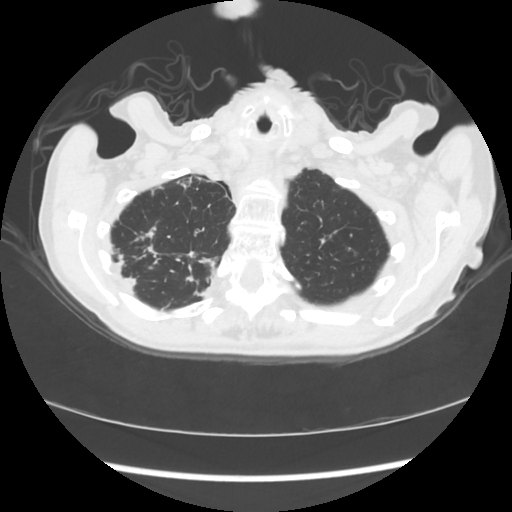

[Series 4: lung windows · axial · 0.64mm/px · z∈[-237,-12]mm · 6 of 126 slices shown]
[im 18/126  lung]
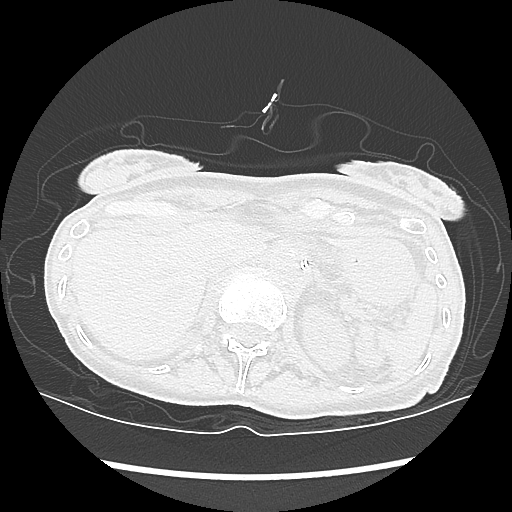
[im 36/126  lung]
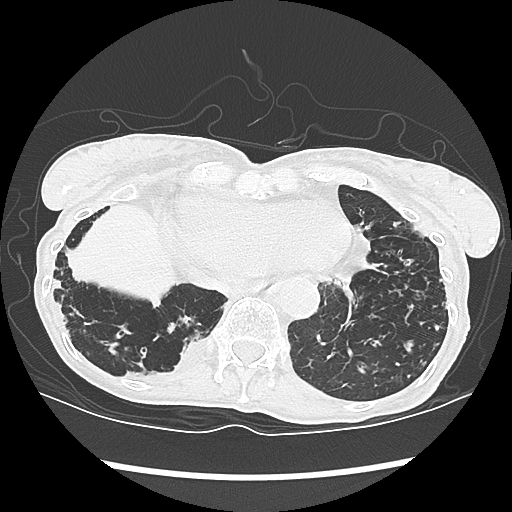
[im 54/126  lung]
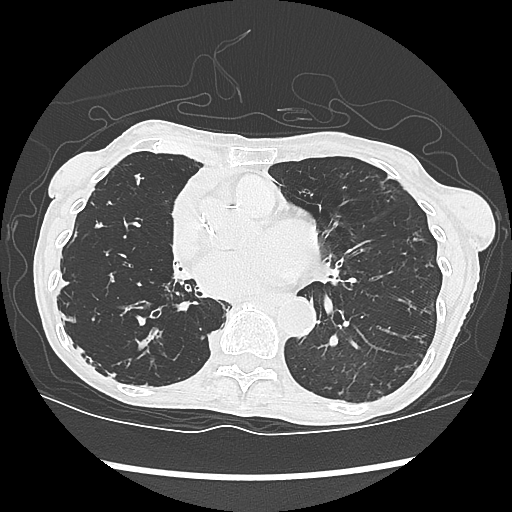
[im 72/126  lung]
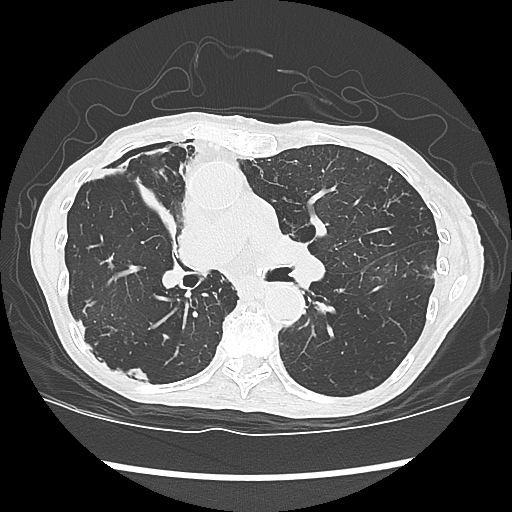
[im 90/126  lung]
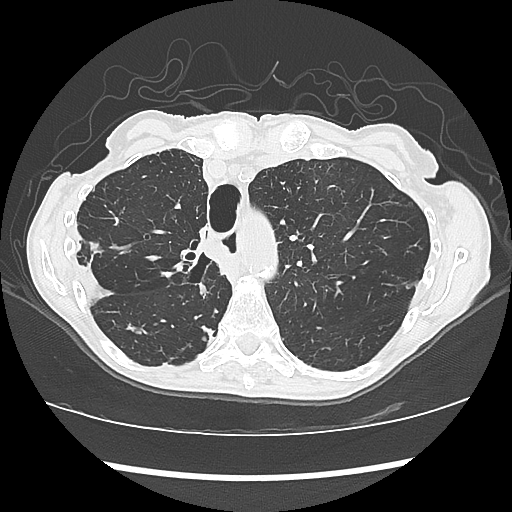
[im 108/126  lung]
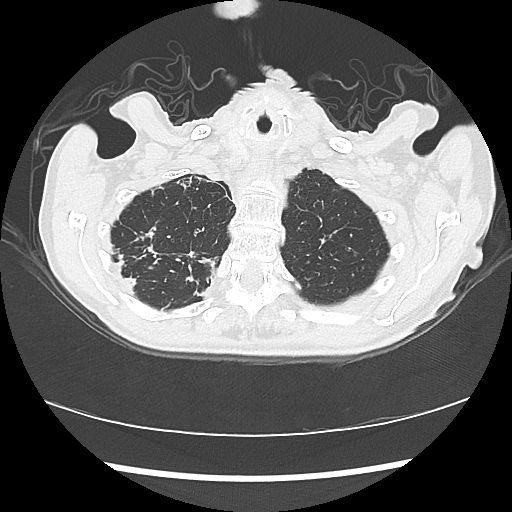

[Series 602: sagittal body · sagittal · 0.64mm/px · 4 of 126 slices shown]
[im 18/126  mediastinal]
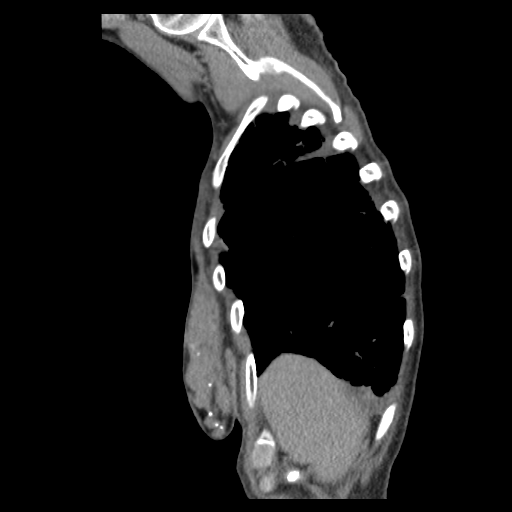
[im 36/126  mediastinal]
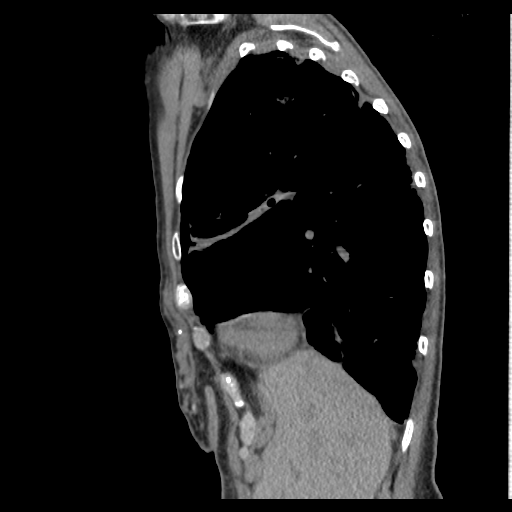
[im 54/126  mediastinal]
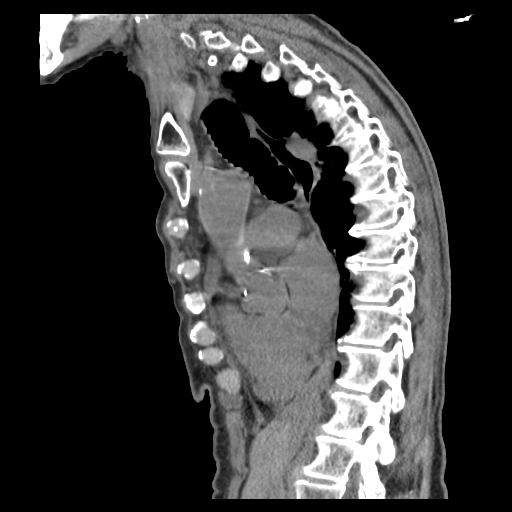
[im 72/126  mediastinal]
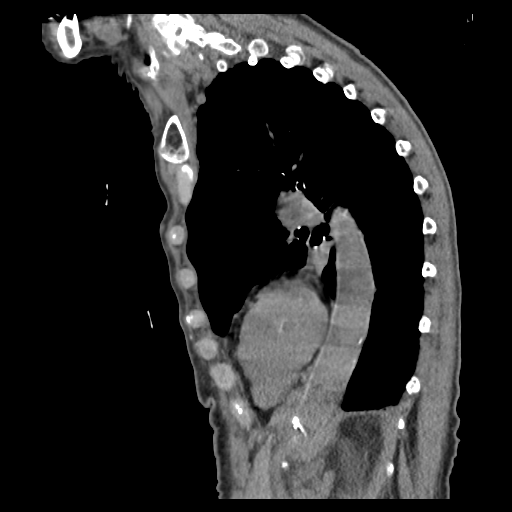

[16 of 30 positions shown; findings below may reference images not displayed]

FINDINGS: Mediastinum/Lymph Nodes: No pathologically enlarged mediastinal or
axillary lymph nodes. Hilar regions are difficult to definitively
evaluate without IV contrast. Atherosclerotic calcification of the
aorta and coronary arteries. Heart is at the upper limits of normal
in size. No pericardial effusion.

Lungs/Pleura: Right apical pleural parenchymal scarring, stable.
Fairly diffuse peribronchovascular nodularity, peribronchial
thickening, mucoid impaction, small areas of consolidation and
architectural distortion, progressive from 06/12/2014. A focal cyst
in the peripheral right lower lobe has a small air-fluid level,
measuring 2.6 cm, previously 1.8 cm. Complete collapse of the right
middle lobe, as before. New trace amount of pleural air in the
apical and anterior right hemi thorax. No pleural fluid. Airway is
otherwise unremarkable. There may be mild air trapping.

Upper abdomen: 9 mm low-attenuation lesion in the right hepatic lobe
is unchanged and likely a cyst. Visualized portions of the liver,
adrenal glands, left kidney, spleen, pancreas and stomach are
otherwise grossly unremarkable.

Musculoskeletal: No worrisome lytic or sclerotic lesions.
IMPRESSION: 1. Trace right pneumothorax. Critical Value/emergent results were
called by telephone at the time of interpretation on 04/20/2016 at
[DATE] to Dr. ELING HARMINI , who verbally acknowledged these
results.
2. Pulmonary parenchymal pattern of fairly diffuse
peribronchovascular nodularity, peribronchial thickening, scattered
mucoid impaction, scattered consolidation and mild architectural
distortion, slightly progressive from 06/12/2014 and most indicative
of mycobacterium avium complex.
3. Aortic atherosclerosis and coronary artery calcification.

## 2018-06-10 IMAGING — DX DG CHEST 2V
2 series · 2 of 2 positions shown · non-contrast
Comparison: CT chest of 04/20/2016 and chest x-ray of 02/26/2016

CLINICAL DATA: Worsening chronic shortness of breath, history of
bronchiectasis

EXAM:
CHEST  2 VIEW

[chest pa]
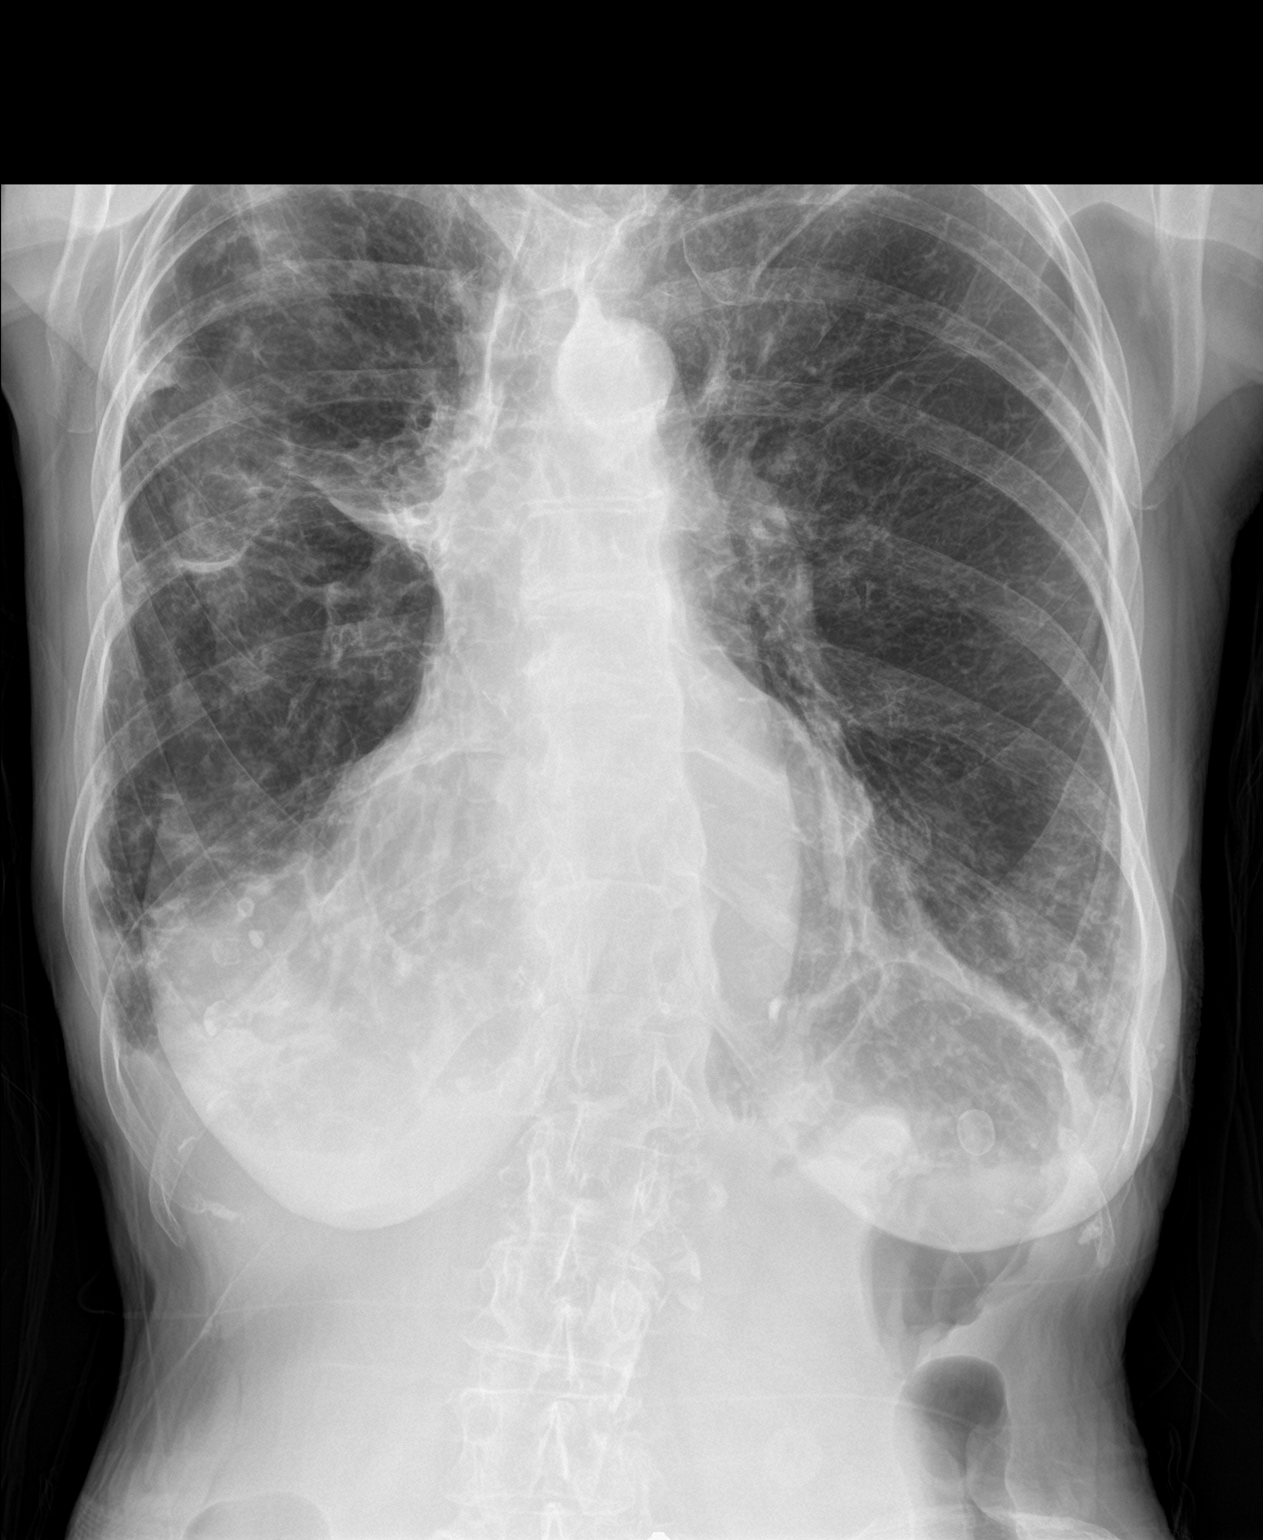

[chest lat]
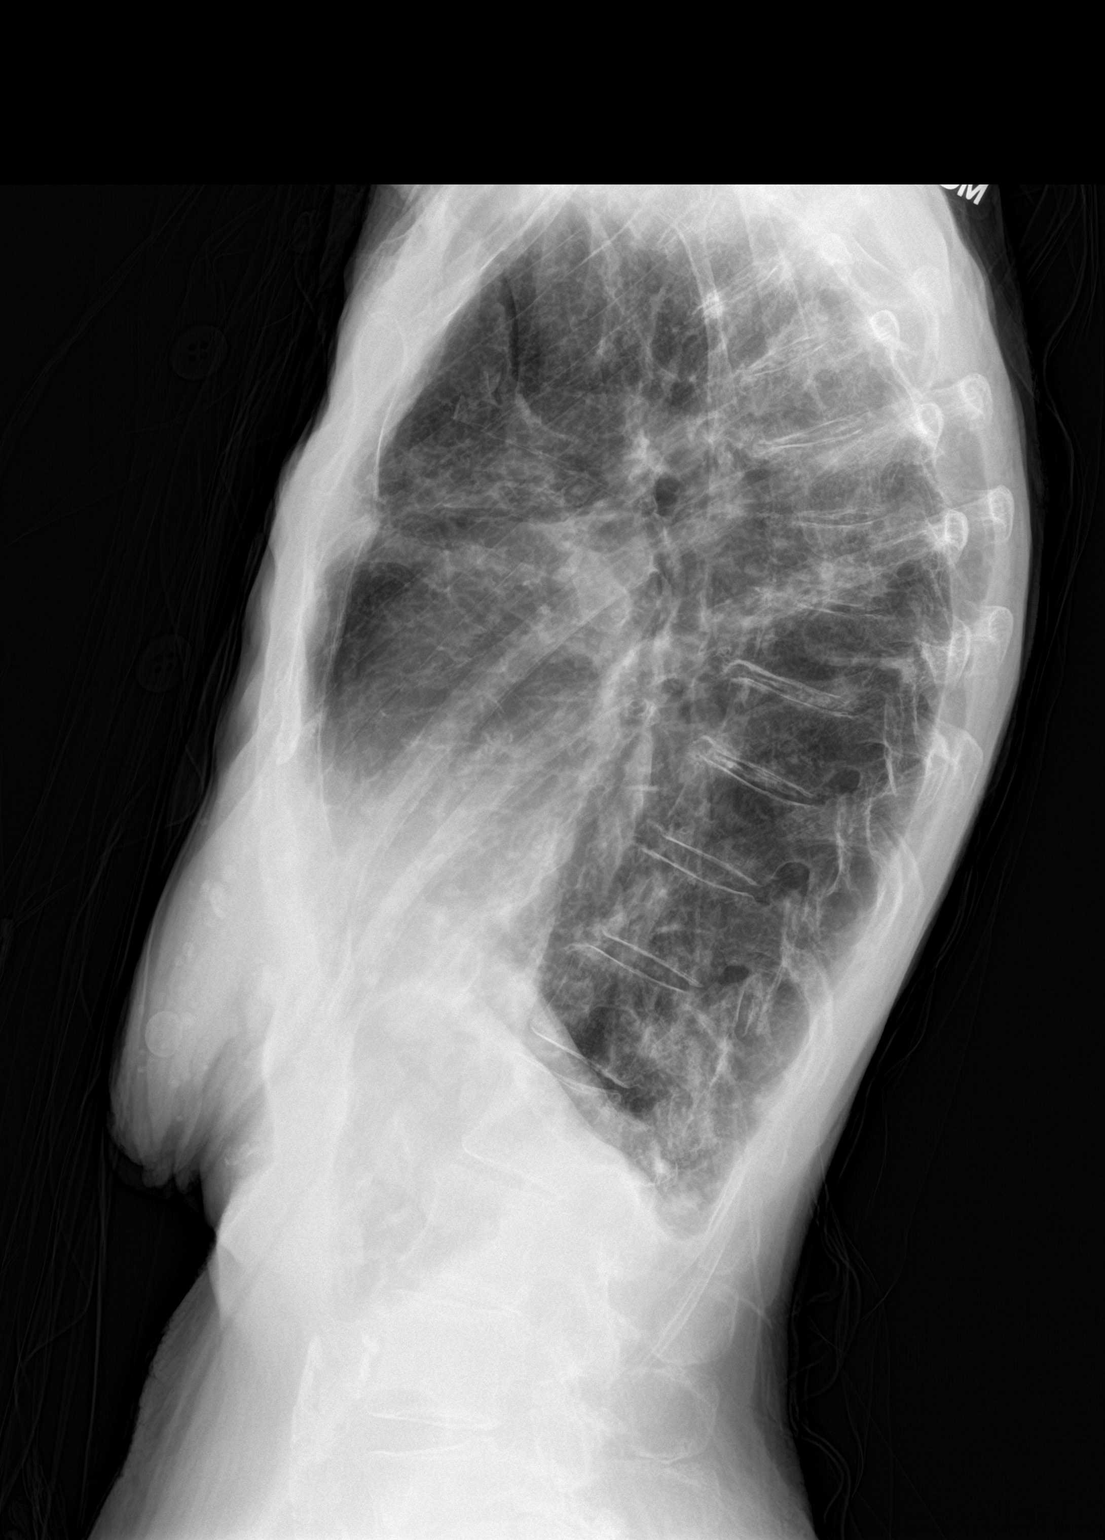

[2 of 2 positions shown; findings below may reference images not displayed]

FINDINGS: Coarse lung markings are again noted primarily involving the lower
lobes but also the right mid and right upper lung field. No new
infiltrate or effusion is seen. By CT previously these changes most
likely indicate atypical infection such as mycobacterium avium
complex. Mediastinal and hilar contours are unchanged. The heart is
within upper limits of normal. No acute bony abnormality is seen.
IMPRESSION: Chronic changes throughout the lungs as noted above most consistent
with atypical infection such as mycobacterium avium complex.
Correlate clinically. No definite new process.

## 2018-08-13 IMAGING — DX DG CHEST 2V
2 series · 2 of 2 positions shown · non-contrast
Comparison: PA and lateral chest x-ray April 29, 2016.

CLINICAL DATA: History of COPD and bronchiectasis. Patient reports
worsening shortness of breath with exertion. Former smoker.

EXAM:
CHEST  2 VIEW

[chest pa]
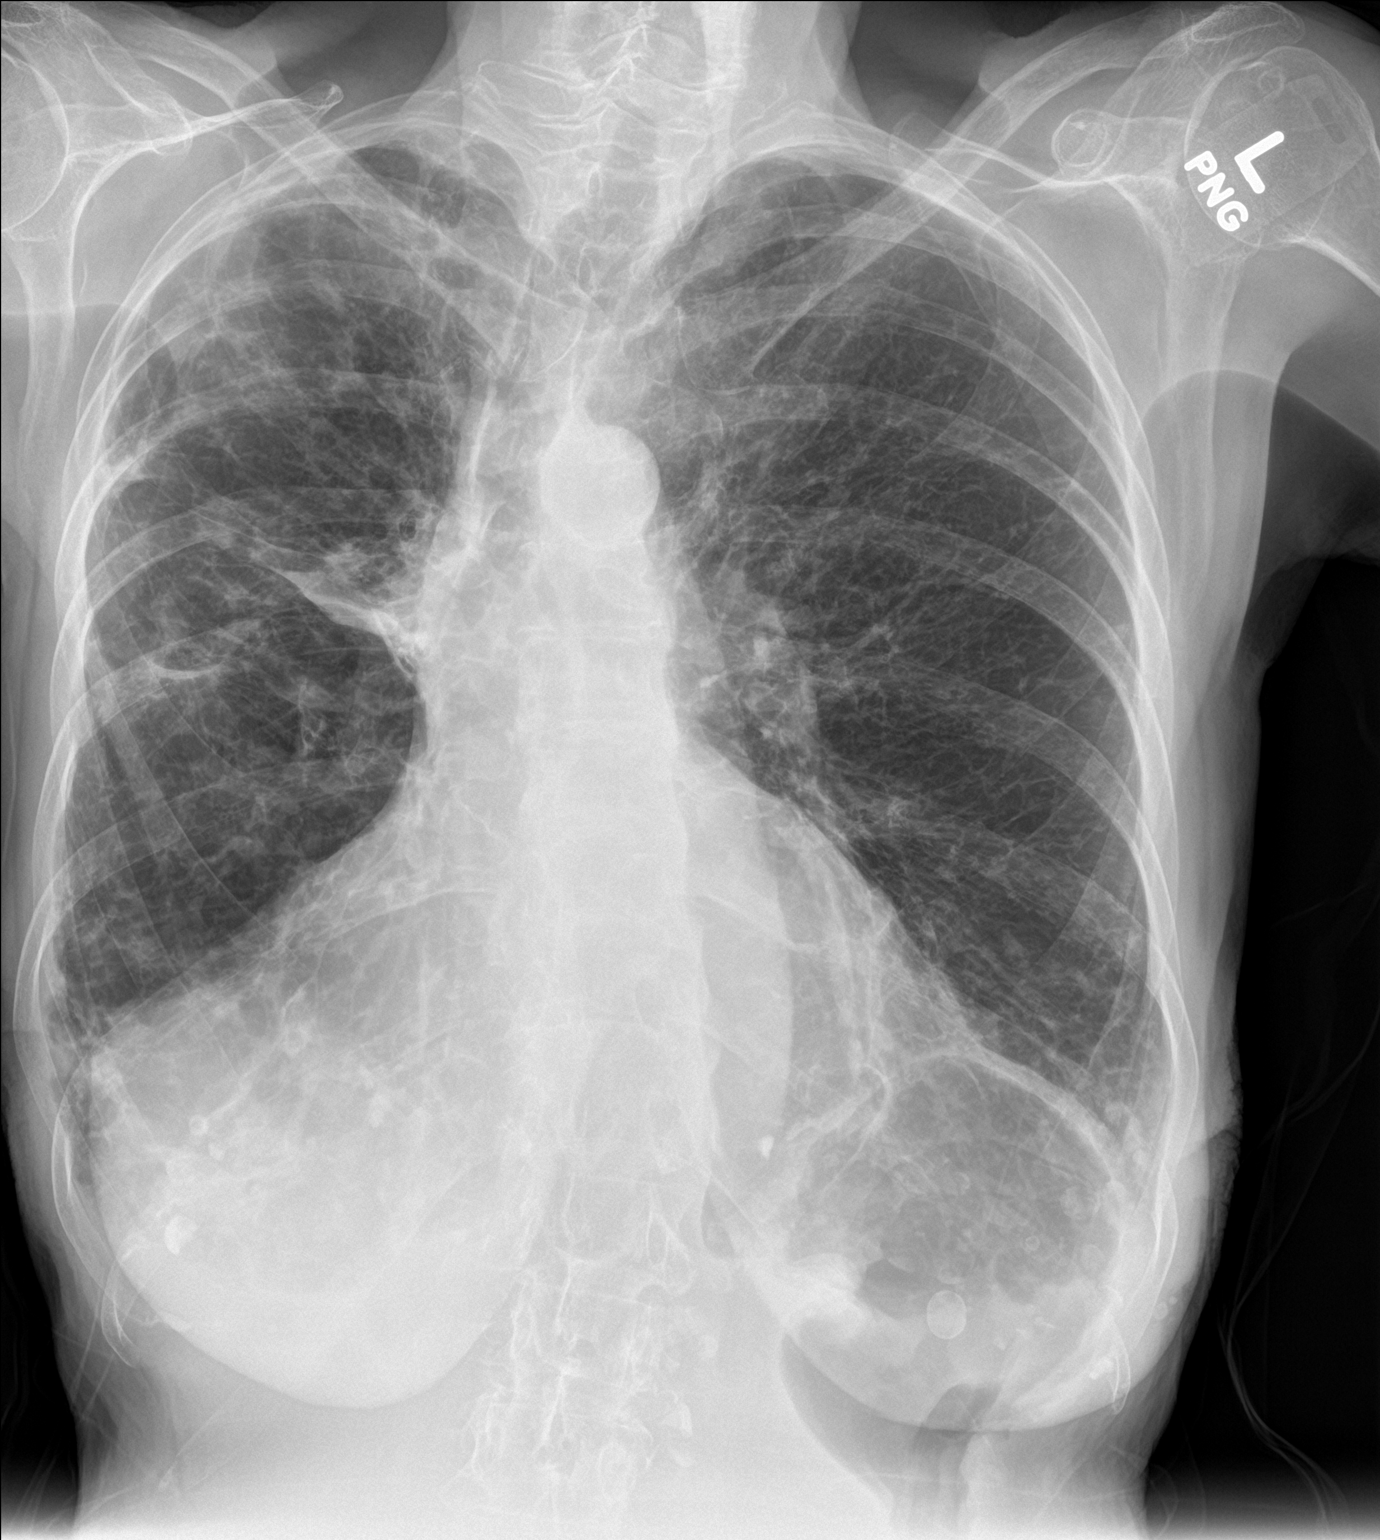

[chest lat]
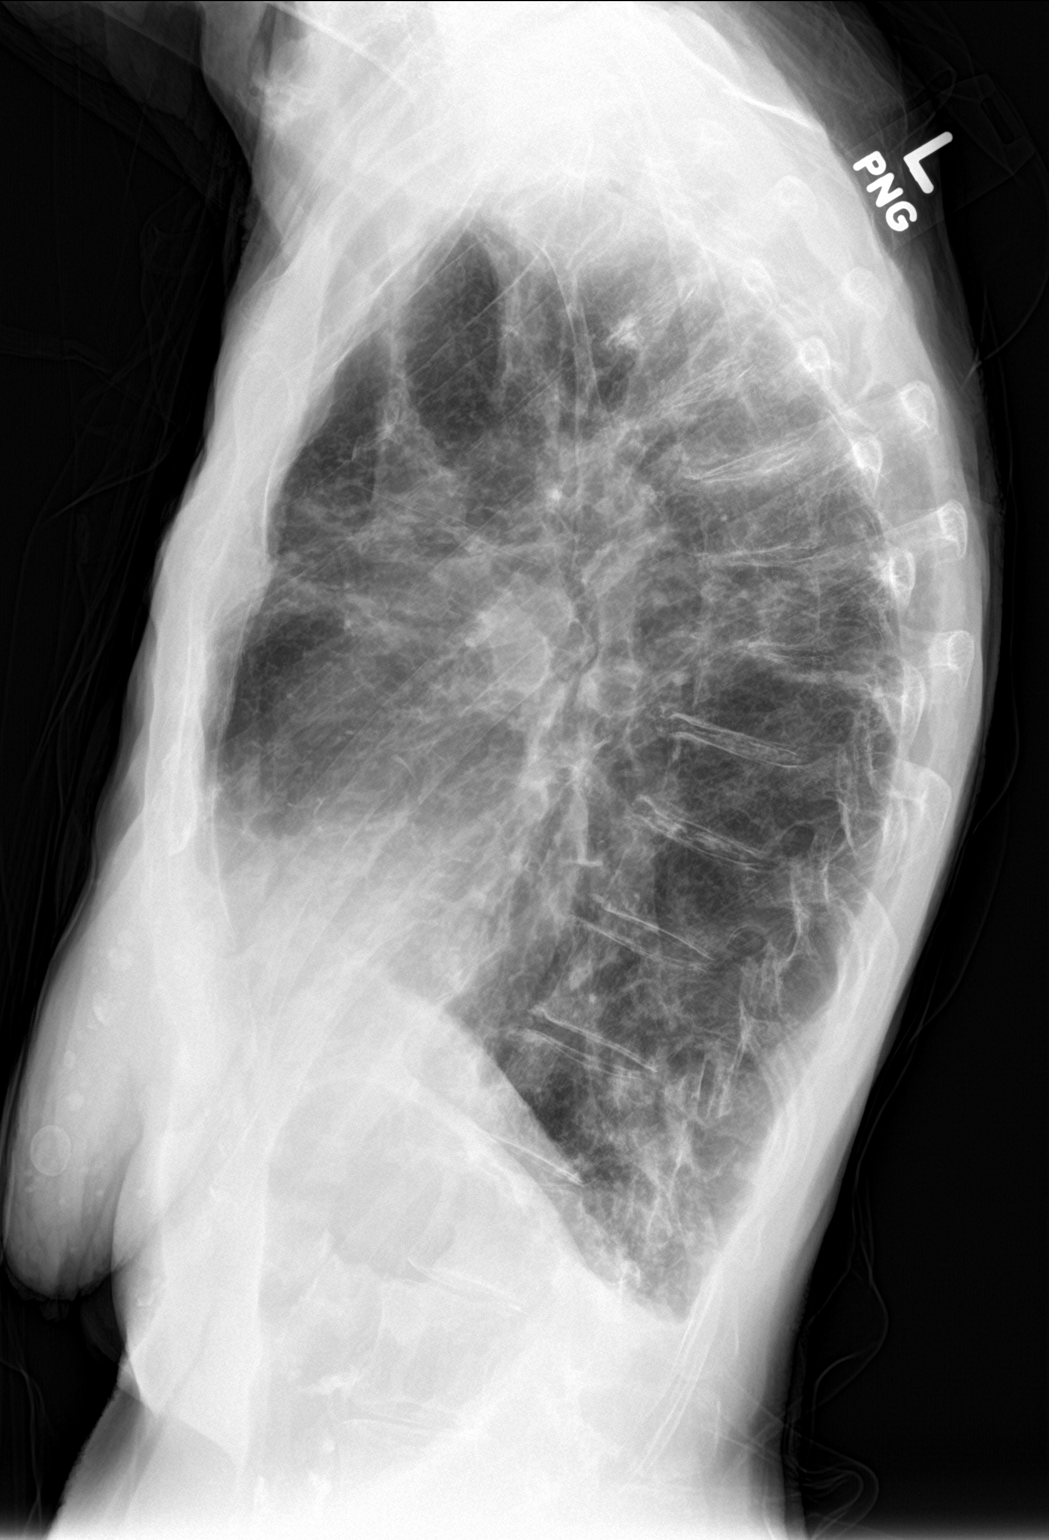

[2 of 2 positions shown; findings below may reference images not displayed]

FINDINGS: The lungs are hyperinflated. Chronically increased interstitial
markings are present bilaterally and stable. No alveolar infiltrate
is observed. The heart is normal in size. The pulmonary vascularity
is not engorged. There is calcification in the wall of the aortic
arch. There is no pleural effusion. There is multilevel degenerative
disc disease of the thoracic spine.
IMPRESSION: COPD. Extensive chronic changes within both lungs consistent with
known bronchiectasis and previous OXENDINE infection. No evidence of
progressive infection nor other significant change.

Aortic atherosclerosis.
